# Patient Record
Sex: Female | Born: 1965 | Race: White | Hispanic: No | Marital: Married | Smoking: Never smoker
Health system: Southern US, Community
[De-identification: ages and names within clinical notes are randomized; demographics above are authoritative.]

## PROBLEM LIST (undated history)

## (undated) DIAGNOSIS — M1990 Unspecified osteoarthritis, unspecified site: Secondary | ICD-10-CM

## (undated) DIAGNOSIS — T7840XA Allergy, unspecified, initial encounter: Secondary | ICD-10-CM

## (undated) DIAGNOSIS — E039 Hypothyroidism, unspecified: Secondary | ICD-10-CM

## (undated) DIAGNOSIS — B019 Varicella without complication: Secondary | ICD-10-CM

## (undated) HISTORY — DX: Varicella without complication: B01.9

## (undated) HISTORY — PX: BREAST SURGERY: SHX581

## (undated) HISTORY — PX: ABDOMINAL HYSTERECTOMY: SHX81

## (undated) HISTORY — PX: REDUCTION MAMMAPLASTY: SUR839

## (undated) HISTORY — DX: Allergy, unspecified, initial encounter: T78.40XA

---

## 2007-11-03 ENCOUNTER — Encounter: Admission: RE | Admit: 2007-11-03 | Discharge: 2007-11-03 | Payer: Self-pay | Admitting: Obstetrics and Gynecology

## 2007-11-11 ENCOUNTER — Encounter: Admission: RE | Admit: 2007-11-11 | Discharge: 2007-11-11 | Payer: Self-pay | Admitting: Obstetrics and Gynecology

## 2011-12-07 ENCOUNTER — Encounter: Payer: Self-pay | Admitting: Family Medicine

## 2011-12-07 ENCOUNTER — Other Ambulatory Visit (INDEPENDENT_AMBULATORY_CARE_PROVIDER_SITE_OTHER): Payer: 59

## 2011-12-07 ENCOUNTER — Ambulatory Visit (INDEPENDENT_AMBULATORY_CARE_PROVIDER_SITE_OTHER): Payer: 59 | Admitting: Family Medicine

## 2011-12-07 VITALS — BP 124/80 | Temp 98.3°F | Ht 61.0 in | Wt 203.0 lb

## 2011-12-07 DIAGNOSIS — R5383 Other fatigue: Secondary | ICD-10-CM

## 2011-12-07 DIAGNOSIS — L659 Nonscarring hair loss, unspecified: Secondary | ICD-10-CM

## 2011-12-07 DIAGNOSIS — Z0000 Encounter for general adult medical examination without abnormal findings: Secondary | ICD-10-CM

## 2011-12-07 DIAGNOSIS — R635 Abnormal weight gain: Secondary | ICD-10-CM

## 2011-12-07 LAB — LIPID PANEL
Cholesterol: 178 mg/dL (ref 0–200)
HDL: 53.1 mg/dL (ref >39.00)
LDL Cholesterol: 103 mg/dL — ABNORMAL HIGH (ref 0–99)
Total CHOL/HDL Ratio: 3
Triglycerides: 109 mg/dL (ref 0.0–149.0)
VLDL: 21.8 mg/dL (ref 0.0–40.0)

## 2011-12-07 LAB — BASIC METABOLIC PANEL
CO2: 27 mEq/L (ref 19–32)
Calcium: 9.2 mg/dL (ref 8.4–10.5)
Creatinine, Ser: 0.8 mg/dL (ref 0.4–1.2)
GFR: 83.85 mL/min (ref >60.00)
Sodium: 136 mEq/L (ref 135–145)

## 2011-12-07 LAB — CBC WITH DIFFERENTIAL/PLATELET
Basophils Absolute: 0 10*3/uL (ref 0.0–0.1)
Basophils Relative: 0.4 % (ref 0.0–3.0)
Eosinophils Absolute: 0.2 10*3/uL (ref 0.0–0.7)
Hemoglobin: 13.5 g/dL (ref 12.0–15.0)
Lymphocytes Relative: 23.7 % (ref 12.0–46.0)
MCHC: 33.5 g/dL (ref 30.0–36.0)
Monocytes Relative: 9.5 % (ref 3.0–12.0)
Neutro Abs: 5.1 10*3/uL (ref 1.4–7.7)
Neutrophils Relative %: 63.9 % (ref 43.0–77.0)
RBC: 4.53 Mil/uL (ref 3.87–5.11)

## 2011-12-07 LAB — HEPATIC FUNCTION PANEL
AST: 28 U/L (ref 0–37)
Albumin: 4 g/dL (ref 3.5–5.2)
Alkaline Phosphatase: 55 U/L (ref 39–117)
Bilirubin, Direct: 0.2 mg/dL (ref 0.0–0.3)

## 2011-12-07 LAB — POCT URINALYSIS DIPSTICK
Blood, UA: NEGATIVE
Glucose, UA: NEGATIVE
Ketones, UA: NEGATIVE
Spec Grav, UA: <=1.005

## 2011-12-07 NOTE — Progress Notes (Signed)
  Subjective:    Patient ID: Savannah Summers, female    DOB: 05/04/1965, 46 y.o.   MRN: [id number]  HPI  Patient to establish care. Past medical history reviewed. She is not currently treated for any chronic medical problems. She has some perennial allergies. Surgical history breast reduction 1980s and uterine rupture 2000 and with partial hysterectomy. This occurred following childbirth.  Patient's had some recent weight gain. She reportedly had abnormal thyroid screen last year during work labs. Reportedly TSH was 8. Never evaluated. She's had some progressive fatigue, 20 pound weight gain over the past year and some alopecia which has been diffuse. Some of her anxiety she attributes to mother passing away several months ago. Mom died of complications of pulmonary fibrosis. Patient does not feel depressed as much as anxious.  Nonsmoker. Rare alcohol use. Walks for exercise about 3 days per week.  Family history mother and sister with history of rheumatoid arthritis.  Past Medical History  Diagnosis Date  . Chicken pox   . Allergy    Past Surgical History  Procedure Date  . Abdominal hysterectomy   . Breast surgery     breast reduction    reports that she has never smoked. She does not have any smokeless tobacco history on file. She reports that she drinks alcohol. Her drug history not on file. family history includes Arthritis in her mother and sister; Cancer in her other; Hypothyroidism in her mother; Pulmonary fibrosis in her mother; and Rheum arthritis in her mother and sister. Not on File    Review of Systems  Constitutional: Positive for fatigue and unexpected weight change. Negative for appetite change.  Eyes: Negative for visual disturbance.  Respiratory: Negative for cough and shortness of breath.   Cardiovascular: Negative for chest pain.  Gastrointestinal: Negative for abdominal pain and abdominal distention.  Genitourinary: Negative for dysuria.  Neurological: Negative  for dizziness and headaches.  Hematological: Negative for adenopathy.  Psychiatric/Behavioral: Negative for dysphoric mood.       Objective:   Physical Exam  Constitutional: She appears well-developed and well-nourished. No distress.  HENT:  Right Ear: External ear normal.  Left Ear: External ear normal.  Mouth/Throat: Oropharynx is clear and moist.  Neck: Neck supple. No thyromegaly present.  Cardiovascular: Normal rate and regular rhythm.  Exam reveals no gallop.   Pulmonary/Chest: Effort normal and breath sounds normal. No respiratory distress. She has no wheezes. She has no rales.  Musculoskeletal: She exhibits no edema.          Assessment & Plan:  Patient presents with gradual weight gain, fatigue, alopecia. Question hypothyroidism. Patient scheduled for complete physical next week. Screening lab work today. Discussed lifestyle modification with diet exercise further than

## 2011-12-09 ENCOUNTER — Encounter: Payer: Self-pay | Admitting: Family Medicine

## 2011-12-14 ENCOUNTER — Ambulatory Visit (INDEPENDENT_AMBULATORY_CARE_PROVIDER_SITE_OTHER): Payer: 59 | Admitting: Family Medicine

## 2011-12-14 ENCOUNTER — Encounter: Payer: Self-pay | Admitting: Family Medicine

## 2011-12-14 VITALS — BP 120/82 | HR 72 | Temp 98.2°F | Resp 12 | Ht 62.0 in | Wt 202.0 lb

## 2011-12-14 DIAGNOSIS — B351 Tinea unguium: Secondary | ICD-10-CM | POA: Insufficient documentation

## 2011-12-14 DIAGNOSIS — Z0000 Encounter for general adult medical examination without abnormal findings: Secondary | ICD-10-CM

## 2011-12-14 DIAGNOSIS — Z299 Encounter for prophylactic measures, unspecified: Secondary | ICD-10-CM

## 2011-12-14 DIAGNOSIS — E039 Hypothyroidism, unspecified: Secondary | ICD-10-CM

## 2011-12-14 MED ORDER — TETANUS-DIPHTH-ACELL PERTUSSIS 5-2.5-18.5 LF-MCG/0.5 IM SUSP: 0.5000 mL | Freq: Once | INTRAMUSCULAR | Status: DC

## 2011-12-14 MED ORDER — LEVOTHYROXINE SODIUM 25 MCG PO TABS: 25.0000 ug | ORAL_TABLET | Freq: Every day | ORAL | Status: DC

## 2011-12-14 MED ORDER — TERBINAFINE HCL 250 MG PO TABS: 250.0000 mg | ORAL_TABLET | Freq: Every day | ORAL | Status: AC

## 2011-12-14 NOTE — Patient Instructions (Addendum)

## 2011-12-14 NOTE — Progress Notes (Signed)
Subjective:    Patient ID: Savannah Summers, female    DOB: [birth year], 46 y.o.   MRN: [id number]  HPI  Patient is seen for complete physical. Refer to prior note. She has no real chronic medical problems. Has gradually gained weight past year with increased fatigue and occasional alopecia. Recent TSH 8. Exercising inconsistently. Has intermittent left knee pain which limits walking. Patient also had onychomycosis changes left great toe. She tried various topicals without relief. She is interested in systemic therapy if possible.  She has occasional associated pain with this nail. She plans to see gynecologist soon. No history of smoking. Last tetanus unknown -possibly over 10 years ago.  Past Medical History  Diagnosis Date  . Chicken pox   . Allergy    Past Surgical History  Procedure Date  . Abdominal hysterectomy   . Breast surgery     breast reduction    reports that she has never smoked. She does not have any smokeless tobacco history on file. She reports that she drinks alcohol. Her drug history not on file. family history includes Arthritis in her mother and sister; Cancer in her other; Hypothyroidism in her mother; Pulmonary fibrosis in her mother; and Rheum arthritis in her mother and sister. No Known Allergies    Review of Systems  Constitutional: Positive for fatigue. Negative for fever, activity change and appetite change.  HENT: Negative for hearing loss, ear pain, sore throat and trouble swallowing.   Eyes: Negative for visual disturbance.  Respiratory: Negative for cough and shortness of breath.   Cardiovascular: Negative for chest pain and palpitations.  Gastrointestinal: Negative for abdominal pain, diarrhea, constipation and blood in stool.  Genitourinary: Negative for dysuria and hematuria.  Musculoskeletal: Positive for arthralgias (Mostly knees). Negative for myalgias and back pain.  Skin: Negative for rash.  Neurological: Negative for dizziness, syncope and  headaches.  Hematological: Negative for adenopathy.  Psychiatric/Behavioral: Negative for confusion and dysphoric mood.       Objective:   Physical Exam  Constitutional: She is oriented to person, place, and time. She appears well-developed and well-nourished.  HENT:  Head: Normocephalic and atraumatic.  Eyes: EOM are normal. Pupils are equal, round, and reactive to light.  Neck: Normal range of motion. Neck supple. No thyromegaly present.  Cardiovascular: Normal rate, regular rhythm and normal heart sounds.   No murmur heard. Pulmonary/Chest: Breath sounds normal. No respiratory distress. She has no wheezes. She has no rales.  Abdominal: Soft. Bowel sounds are normal. She exhibits no distension and no mass. There is no tenderness. There is no rebound and no guarding.  Genitourinary:       Per gyn  Musculoskeletal: Normal range of motion. She exhibits no edema.  Lymphadenopathy:    She has no cervical adenopathy.  Neurological: She is alert and oriented to person, place, and time. She displays normal reflexes. No cranial nerve deficit.  Skin: No rash noted.       Left great toe no refills brittle changes with some thickening and scaling. Approximately 1/2 the nail is broken off  Psychiatric: She has a normal mood and affect. Her behavior is normal. Judgment and thought content normal.          Assessment & Plan:  Complete physical. Labs reviewed. Tetanus given. Increase exercise and work on weight loss.  Hypothyroidism. Initiate Levothroid 25 mcg once daily. Recheck TSH 3 months  Onychomycosis left great toenail. Discussed risk and benefits of treatment with Lamisil and patient consents to start treatment.  Lamisil 250 mg once daily for 3 months.

## 2012-02-20 ENCOUNTER — Other Ambulatory Visit: Payer: Self-pay | Admitting: Obstetrics and Gynecology

## 2012-02-20 DIAGNOSIS — Z9889 Other specified postprocedural states: Secondary | ICD-10-CM

## 2012-02-20 DIAGNOSIS — Z1231 Encounter for screening mammogram for malignant neoplasm of breast: Secondary | ICD-10-CM

## 2012-03-06 ENCOUNTER — Other Ambulatory Visit (INDEPENDENT_AMBULATORY_CARE_PROVIDER_SITE_OTHER): Payer: 59

## 2012-03-06 DIAGNOSIS — E039 Hypothyroidism, unspecified: Secondary | ICD-10-CM

## 2012-03-07 ENCOUNTER — Other Ambulatory Visit: Payer: Self-pay | Admitting: Family Medicine

## 2012-03-07 ENCOUNTER — Telehealth: Payer: Self-pay | Admitting: Family Medicine

## 2012-03-07 MED ORDER — LEVOTHYROXINE SODIUM 25 MCG PO TABS: 25.0000 ug | ORAL_TABLET | Freq: Every day | ORAL | Status: DC

## 2012-03-07 NOTE — Telephone Encounter (Signed)
Patient calling to find out results of labwork done 03/06/12.  States she expected to hear from office with results 03/06/12 so is calling in follow up.  Per Epic, TSH results available but not released by MD; info to office for provider review/callback.   Patient also requests activation code for MyChart.   May reach patient at [phone].

## 2012-03-09 NOTE — Telephone Encounter (Signed)
I think this patient has been called with result.  Make sure we give her activation code, if she does not already have.

## 2012-03-10 ENCOUNTER — Encounter: Payer: Self-pay | Admitting: *Deleted

## 2012-03-10 NOTE — Telephone Encounter (Signed)
I sent pt Mychart information to her home

## 2012-03-20 ENCOUNTER — Ambulatory Visit
Admission: RE | Admit: 2012-03-20 | Discharge: 2012-03-20 | Disposition: A | Discharge status: Home / Self Care | Payer: 59 | Source: Ambulatory Visit | Attending: Obstetrics and Gynecology | Admitting: Obstetrics and Gynecology

## 2012-03-20 DIAGNOSIS — Z9889 Other specified postprocedural states: Secondary | ICD-10-CM

## 2012-03-20 DIAGNOSIS — Z1231 Encounter for screening mammogram for malignant neoplasm of breast: Secondary | ICD-10-CM

## 2012-05-31 ENCOUNTER — Other Ambulatory Visit: Payer: Self-pay

## 2012-12-08 ENCOUNTER — Other Ambulatory Visit: Payer: Self-pay | Admitting: Family Medicine

## 2012-12-19 ENCOUNTER — Encounter: Payer: Self-pay | Admitting: Family Medicine

## 2012-12-19 ENCOUNTER — Ambulatory Visit (INDEPENDENT_AMBULATORY_CARE_PROVIDER_SITE_OTHER): Payer: 59 | Admitting: Family Medicine

## 2012-12-19 VITALS — BP 126/80 | HR 60 | Temp 97.6°F | Wt 193.0 lb

## 2012-12-19 DIAGNOSIS — E669 Obesity, unspecified: Secondary | ICD-10-CM | POA: Insufficient documentation

## 2012-12-19 DIAGNOSIS — E039 Hypothyroidism, unspecified: Secondary | ICD-10-CM

## 2012-12-19 MED ORDER — LEVOTHYROXINE SODIUM 25 MCG PO TABS: 25.0000 ug | ORAL_TABLET | Freq: Every day | ORAL | Status: DC

## 2012-12-19 NOTE — Progress Notes (Signed)
  Subjective:    Patient ID: Savannah Summers, female    DOB: 26-Apr-1965, 47 y.o.   MRN: [id number]  HPI Patient for followup hypothyroidism. She takes low-dose levothyroxine 25 mcg daily. Compliant with therapy. She denies any side effects. She denies any fatigue or any other symptoms of hypothyroidism such as constipation or cold intolerance. She continues to see gynecologist yearly for well visit. Needs followup TSH. She plans to get flu vaccine through her work  Past Medical History  Diagnosis Date  . Chicken pox   . Allergy    Past Surgical History  Procedure Laterality Date  . Abdominal hysterectomy    . Breast surgery      breast reduction    reports that she has never smoked. She does not have any smokeless tobacco history on file. She reports that  drinks alcohol. Her drug history is not on file. family history includes Arthritis in her mother and sister; Cancer in her other; Hypothyroidism in her mother; Pulmonary fibrosis in her mother; Rheum arthritis in her mother and sister. No Known Allergies    Review of Systems  Constitutional: Negative for appetite change, fatigue and unexpected weight change.  Respiratory: Negative for cough and shortness of breath.   Cardiovascular: Negative for chest pain.       Objective:   Physical Exam  Constitutional: She appears well-developed and well-nourished.  Neck: Neck supple. No thyromegaly present.  Cardiovascular: Normal rate and regular rhythm.   Pulmonary/Chest: Effort normal and breath sounds normal. No respiratory distress. She has no wheezes. She has no rales.  Musculoskeletal: She exhibits no edema.          Assessment & Plan:  Hypothyroidism. Clinically stable. Recheck TSH. Refill medication for one year.

## 2012-12-19 NOTE — Patient Instructions (Signed)

## 2012-12-22 MED ORDER — LEVOTHYROXINE SODIUM 50 MCG PO TABS: 50.0000 ug | ORAL_TABLET | Freq: Every day | ORAL | Status: DC

## 2012-12-22 NOTE — Addendum Note (Signed)
Addended by: Thomasena Edis on: 12/22/2012 12:34 PM   Modules accepted: Orders

## 2013-02-19 ENCOUNTER — Other Ambulatory Visit: Payer: Self-pay

## 2013-02-25 ENCOUNTER — Ambulatory Visit (INDEPENDENT_AMBULATORY_CARE_PROVIDER_SITE_OTHER): Payer: 59 | Admitting: Family Medicine

## 2013-02-25 ENCOUNTER — Encounter: Payer: Self-pay | Admitting: Family Medicine

## 2013-02-25 VITALS — BP 128/78 | HR 84 | Temp 98.0°F | Wt 197.0 lb

## 2013-02-25 DIAGNOSIS — J209 Acute bronchitis, unspecified: Secondary | ICD-10-CM

## 2013-02-25 MED ORDER — BENZONATATE 200 MG PO CAPS: 200.0000 mg | ORAL_CAPSULE | Freq: Three times a day (TID) | ORAL | Status: DC | PRN

## 2013-02-25 NOTE — Patient Instructions (Signed)
Acute Bronchitis Bronchitis is inflammation of the airways that extend from the windpipe into the lungs (bronchi). The inflammation often causes mucus to develop. This leads to a cough, which is the most common symptom of bronchitis.  In acute bronchitis, the condition usually develops suddenly and goes away over time, usually in a couple weeks. Smoking, allergies, and asthma can make bronchitis worse. Repeated episodes of bronchitis may cause further lung problems.  CAUSES Acute bronchitis is most often caused by the same virus that causes a cold. The virus can spread from person to person (contagious).  SIGNS AND SYMPTOMS   Cough.   Fever.   Coughing up mucus.   Body aches.   Chest congestion.   Chills.   Shortness of breath.   Sore throat.  DIAGNOSIS  Acute bronchitis is usually diagnosed through a physical exam. Tests, such as chest X-rays, are sometimes done to rule out other conditions.  TREATMENT  Acute bronchitis usually goes away in a couple weeks. Often times, no medical treatment is necessary. Medicines are sometimes given for relief of fever or cough. Antibiotics are usually not needed but may be prescribed in certain situations. In some cases, an inhaler may be recommended to help reduce shortness of breath and control the cough. A cool mist vaporizer may also be used to help thin bronchial secretions and make it easier to clear the chest.  HOME CARE INSTRUCTIONS  Get plenty of rest.   Drink enough fluids to keep your urine clear or pale yellow (unless you have a medical condition that requires fluid restriction). Increasing fluids may help thin your secretions and will prevent dehydration.   Only take over-the-counter or prescription medicines as directed by your health care provider.   Avoid smoking and secondhand smoke. Exposure to cigarette smoke or irritating chemicals will make bronchitis worse. If you are a smoker, consider using nicotine gum or skin  patches to help control withdrawal symptoms. Quitting smoking will help your lungs heal faster.   Reduce the chances of another bout of acute bronchitis by washing your hands frequently, avoiding people with cold symptoms, and trying not to touch your hands to your mouth, nose, or eyes.   Follow up with your health care provider as directed.  SEEK MEDICAL CARE IF: Your symptoms do not improve after 1 week of treatment.  SEEK IMMEDIATE MEDICAL CARE IF:  You develop an increased fever or chills.   You have chest pain.   You have severe shortness of breath.  You have bloody sputum.   You develop dehydration.  You develop fainting.  You develop repeated vomiting.  You develop a severe headache. MAKE SURE YOU:   Understand these instructions.  Will watch your condition.  Will get help right away if you are not doing well or get worse. Document Released: 05/10/2004 Document Revised: 12/03/2012 Document Reviewed: 09/23/2012 [facility] Patient Information 2014 [place], [place].  Consider Zantac or Pepcid for throat irritation symptoms.

## 2013-02-25 NOTE — Progress Notes (Signed)
  Subjective:    Patient ID: Savannah Summers, female    DOB: 20-Aug-1965, 47 y.o.   MRN: [id number]  HPI Patient here for acute visit Onset about 5 days ago of body aches, malaise, intermittent headache, cough and minimal nasal congestion. This past Sunday she had fever up to 101 and chills but no documented fever since then. She denies any nausea or vomiting. Husband now has similar symptoms. Cough especially bothersome at night. Interfering with sleep. No wheezing. No dyspnea. Nonsmoker  Past Medical History  Diagnosis Date  . Chicken pox   . Allergy    Past Surgical History  Procedure Laterality Date  . Abdominal hysterectomy    . Breast surgery      breast reduction    reports that she has never smoked. She does not have any smokeless tobacco history on file. She reports that she drinks alcohol. Her drug history is not on file. family history includes Arthritis in her mother and sister; Cancer in her other; Hypothyroidism in her mother; Pulmonary fibrosis in her mother; Rheum arthritis in her mother and sister. No Known Allergies    Review of Systems  Constitutional: Positive for fatigue. Negative for fever.  HENT: Positive for sore throat.   Respiratory: Positive for cough. Negative for wheezing.   Neurological: Positive for headaches.       Objective:   Physical Exam  Constitutional: She appears well-developed and well-nourished.  HENT:  Right Ear: External ear normal.  Left Ear: External ear normal.  Mouth/Throat: Oropharynx is clear and moist.  Neck: Neck supple.  Cardiovascular: Normal rate and regular rhythm.   Pulmonary/Chest: Effort normal and breath sounds normal. No respiratory distress. She has no wheezes. She has no rales.  Lymphadenopathy:    She has no cervical adenopathy.          Assessment & Plan:  Acute bronchitis. Suspect viral. Tessalon Perles 200 mg every 8 hours as needed for cough. Followup promptly for recurrent fever or worsening  symptoms

## 2013-02-25 NOTE — Progress Notes (Signed)
Pre visit review using our clinic review tool, if applicable. No additional management support is needed unless otherwise documented below in the visit note. 

## 2013-04-21 ENCOUNTER — Other Ambulatory Visit (INDEPENDENT_AMBULATORY_CARE_PROVIDER_SITE_OTHER): Payer: 59

## 2013-04-21 ENCOUNTER — Encounter: Payer: Self-pay | Admitting: Family Medicine

## 2013-04-21 DIAGNOSIS — E039 Hypothyroidism, unspecified: Secondary | ICD-10-CM

## 2013-04-21 LAB — TSH: TSH: 1.36 u[IU]/mL (ref 0.35–5.50)

## 2013-07-31 ENCOUNTER — Other Ambulatory Visit: Payer: Self-pay

## 2013-07-31 DIAGNOSIS — Z1231 Encounter for screening mammogram for malignant neoplasm of breast: Secondary | ICD-10-CM

## 2013-08-07 ENCOUNTER — Ambulatory Visit
Admission: RE | Admit: 2013-08-07 | Discharge: 2013-08-07 | Disposition: A | Discharge status: Home / Self Care | Payer: Self-pay | Source: Ambulatory Visit

## 2013-08-07 DIAGNOSIS — Z1231 Encounter for screening mammogram for malignant neoplasm of breast: Secondary | ICD-10-CM

## 2013-09-15 ENCOUNTER — Ambulatory Visit: Payer: 59 | Attending: Orthopedic Surgery

## 2013-09-15 DIAGNOSIS — M25669 Stiffness of unspecified knee, not elsewhere classified: Secondary | ICD-10-CM | POA: Insufficient documentation

## 2013-09-15 DIAGNOSIS — M25569 Pain in unspecified knee: Secondary | ICD-10-CM | POA: Insufficient documentation

## 2013-09-15 DIAGNOSIS — IMO0001 Reserved for inherently not codable concepts without codable children: Secondary | ICD-10-CM | POA: Insufficient documentation

## 2013-09-15 DIAGNOSIS — R262 Difficulty in walking, not elsewhere classified: Secondary | ICD-10-CM | POA: Insufficient documentation

## 2013-09-17 ENCOUNTER — Encounter: Payer: 59 | Admitting: Physical Therapy

## 2013-09-23 ENCOUNTER — Ambulatory Visit: Payer: 59 | Admitting: Rehabilitation

## 2013-09-28 ENCOUNTER — Ambulatory Visit: Payer: 59 | Admitting: Rehabilitation

## 2013-10-01 ENCOUNTER — Encounter: Payer: 59 | Admitting: Physical Therapy

## 2013-10-05 ENCOUNTER — Encounter: Payer: 59 | Admitting: Rehabilitation

## 2013-10-08 ENCOUNTER — Encounter: Payer: 59 | Admitting: Rehabilitation

## 2013-12-22 ENCOUNTER — Other Ambulatory Visit: Payer: Self-pay | Admitting: Family Medicine

## 2013-12-25 ENCOUNTER — Ambulatory Visit (INDEPENDENT_AMBULATORY_CARE_PROVIDER_SITE_OTHER): Payer: 59 | Admitting: Family Medicine

## 2013-12-25 ENCOUNTER — Encounter: Payer: Self-pay | Admitting: Family Medicine

## 2013-12-25 VITALS — BP 128/76 | HR 78 | Wt 200.0 lb

## 2013-12-25 DIAGNOSIS — E038 Other specified hypothyroidism: Secondary | ICD-10-CM

## 2013-12-25 LAB — TSH: TSH: 0.53 u[IU]/mL (ref 0.35–4.50)

## 2013-12-25 NOTE — Progress Notes (Signed)
   Subjective:    Patient ID: Savannah Summers, female    DOB: [birth year], 48 y.o.   MRN: [id number]  HPI  Followup hypothyroidism. Takes levothyroxin 50 mcg daily. Compliant with therapy. Last lab work was in January. Denies any symptoms of hypothyroidism. No side effects from medication.  Past Medical History  Diagnosis Date  . Chicken pox   . Allergy    Past Surgical History  Procedure Laterality Date  . Abdominal hysterectomy    . Breast surgery      breast reduction    reports that she has never smoked. She does not have any smokeless tobacco history on file. She reports that she drinks alcohol. Her drug history is not on file. family history includes Arthritis in her mother and sister; Cancer in her other; Hypothyroidism in her mother; Pulmonary fibrosis in her mother; Rheum arthritis in her mother and sister. No Known Allergies   Review of Systems  Constitutional: Negative for appetite change and unexpected weight change.  Respiratory: Negative for shortness of breath.   Cardiovascular: Negative for chest pain.  Endocrine: Negative for cold intolerance.       Objective:   Physical Exam  Constitutional: She appears well-developed and well-nourished.  Neck: Neck supple. No thyromegaly present.  Cardiovascular: Normal rate and regular rhythm.   Pulmonary/Chest: Effort normal and breath sounds normal. No respiratory distress. She has no wheezes. She has no rales.  Musculoskeletal: She exhibits no edema.          Assessment & Plan:  Hypothyroidism. Recheck TSH. Adjust medication accordingly

## 2013-12-25 NOTE — Progress Notes (Signed)
Pre visit review using our clinic review tool, if applicable. No additional management support is needed unless otherwise documented below in the visit note. 

## 2014-02-11 ENCOUNTER — Encounter: Payer: Self-pay | Admitting: Family Medicine

## 2014-02-15 ENCOUNTER — Encounter: Payer: Self-pay | Admitting: Family Medicine

## 2014-03-22 ENCOUNTER — Other Ambulatory Visit: Payer: Self-pay | Admitting: Family Medicine

## 2014-12-16 ENCOUNTER — Other Ambulatory Visit: Payer: Self-pay | Admitting: Family Medicine

## 2014-12-16 ENCOUNTER — Telehealth: Payer: Self-pay | Admitting: Family Medicine

## 2014-12-16 MED ORDER — LEVOTHYROXINE SODIUM 50 MCG PO TABS: 50.0000 ug | ORAL_TABLET | Freq: Every day | ORAL | Status: DC

## 2014-12-16 NOTE — Telephone Encounter (Signed)
Pt request refill of the following: levothyroxine (SYNTHROID, LEVOTHROID) 50 MCG tablet  Pt has made an appt for 12/24/14 and is asking for some med till her appt    Phamacy: [facility]

## 2014-12-16 NOTE — Telephone Encounter (Signed)
Rx sent to pharmacy   

## 2014-12-24 ENCOUNTER — Ambulatory Visit (INDEPENDENT_AMBULATORY_CARE_PROVIDER_SITE_OTHER): Payer: 59 | Admitting: Family Medicine

## 2014-12-24 ENCOUNTER — Ambulatory Visit: Payer: 59 | Admitting: Family Medicine

## 2014-12-24 ENCOUNTER — Encounter: Payer: Self-pay | Admitting: Family Medicine

## 2014-12-24 VITALS — BP 104/70 | HR 58 | Temp 97.5°F | Wt 189.7 lb

## 2014-12-24 DIAGNOSIS — E038 Other specified hypothyroidism: Secondary | ICD-10-CM

## 2014-12-24 LAB — TSH: TSH: 3.68 u[IU]/mL (ref 0.35–4.50)

## 2014-12-24 MED ORDER — LEVOTHYROXINE SODIUM 50 MCG PO TABS: 50.0000 ug | ORAL_TABLET | Freq: Every day | ORAL | Status: DC

## 2014-12-24 NOTE — Progress Notes (Signed)
   Subjective:    Patient ID: Savannah Summers, female    DOB: [birth year], 49 y.o.   MRN: [id number]  HPI Follow-up hypothyroidism. She takes levothyroxin 50 g daily. She has joined [employer] recently started exercising 3-4 days a week. She is lost about 11 pounds since last year. She feels great overall. Compliant with medication. No side effects.  Past Medical History  Diagnosis Date  . Chicken pox   . Allergy    Past Surgical History  Procedure Laterality Date  . Abdominal hysterectomy    . Breast surgery      breast reduction    reports that she has never smoked. She does not have any smokeless tobacco history on file. She reports that she drinks alcohol. Her drug history is not on file. family history includes Arthritis in her mother and sister; Cancer in her other; Hypothyroidism in her mother; Pulmonary fibrosis in her mother; Rheum arthritis in her mother and sister. No Known Allergies    Review of Systems  Constitutional: Negative for chills, appetite change and unexpected weight change.  Respiratory: Negative for shortness of breath.   Cardiovascular: Negative for chest pain.       Objective:   Physical Exam  Constitutional: She appears well-developed and well-nourished.  Neck: Neck supple. No thyromegaly present.  Cardiovascular: Normal rate and regular rhythm.  Exam reveals no gallop.   Pulmonary/Chest: Effort normal and breath sounds normal. No respiratory distress. She has no wheezes. She has no rales.  Musculoskeletal: She exhibits no edema.          Assessment & Plan:  Hypothyroidism. Recheck TSH. Refill medication for one year. Continue weight loss efforts. Consider complete physical at some point this year

## 2014-12-24 NOTE — Progress Notes (Signed)
Pre visit review using our clinic review tool, if applicable. No additional management support is needed unless otherwise documented below in the visit note. 

## 2014-12-24 NOTE — Patient Instructions (Signed)
Hypothyroidism The thyroid is a large gland located in the lower front of your neck. The thyroid gland helps control metabolism. Metabolism is how your body handles food. It controls metabolism with the hormone thyroxine. When this gland is underactive (hypothyroid), it produces too little hormone.  CAUSES These include:   Absence or destruction of thyroid tissue.  Goiter due to iodine deficiency.  Goiter due to medications.  Congenital defects (since birth).  Problems with the pituitary. This causes a lack of TSH (thyroid stimulating hormone). This hormone tells the thyroid to turn out more hormone. SYMPTOMS  Lethargy (feeling as though you have no energy)  Cold intolerance  Weight gain (in spite of normal food intake)  Dry skin  Coarse hair  Menstrual irregularity (if severe, may lead to infertility)  Slowing of thought processes Cardiac problems are also caused by insufficient amounts of thyroid hormone. Hypothyroidism in the newborn is cretinism, and is an extreme form. It is important that this form be treated adequately and immediately or it will lead rapidly to retarded physical and mental development. DIAGNOSIS  To prove hypothyroidism, your caregiver may do blood tests and ultrasound tests. Sometimes the signs are hidden. It may be necessary for your caregiver to watch this illness with blood tests either before or after diagnosis and treatment. TREATMENT  Low levels of thyroid hormone are increased by using synthetic thyroid hormone. This is a safe, effective treatment. It usually takes about four weeks to gain the full effects of the medication. After you have the full effect of the medication, it will generally take another four weeks for problems to leave. Your caregiver may start you on low doses. If you have had heart problems the dose may be gradually increased. It is generally not an emergency to get rapidly to normal. HOME CARE INSTRUCTIONS   Take your  medications as your caregiver suggests. Let your caregiver know of any medications you are taking or start taking. Your caregiver will help you with dosage schedules.  As your condition improves, your dosage needs may increase. It will be necessary to have continuing blood tests as suggested by your caregiver.  Report all suspected medication side effects to your caregiver. SEEK MEDICAL CARE IF: Seek medical care if you develop:  Sweating.  Tremulousness (tremors).  Anxiety.  Rapid weight loss.  Heat intolerance.  Emotional swings.  Diarrhea.  Weakness. SEEK IMMEDIATE MEDICAL CARE IF:  You develop chest pain, an irregular heart beat (palpitations), or a rapid heart beat. MAKE SURE YOU:   Understand these instructions.  Will watch your condition.  Will get help right away if you are not doing well or get worse. Document Released: 04/02/2005 Document Revised: 06/25/2011 Document Reviewed: 11/21/2007 [employer] Patient Information 2015 [employer]. This information is not intended to replace advice given to you by your health care provider. Make sure you discuss any questions you have with your health care provider.  

## 2015-01-07 IMAGING — MG MM SCREEN MAMMOGRAM BILATERAL
5 series · 5 of 5 positions shown · non-contrast
Comparison: Previous exam(s).

CLINICAL DATA: Screening.

EXAM:
DIGITAL SCREENING BILATERAL MAMMOGRAM WITH CAD

[R CC]
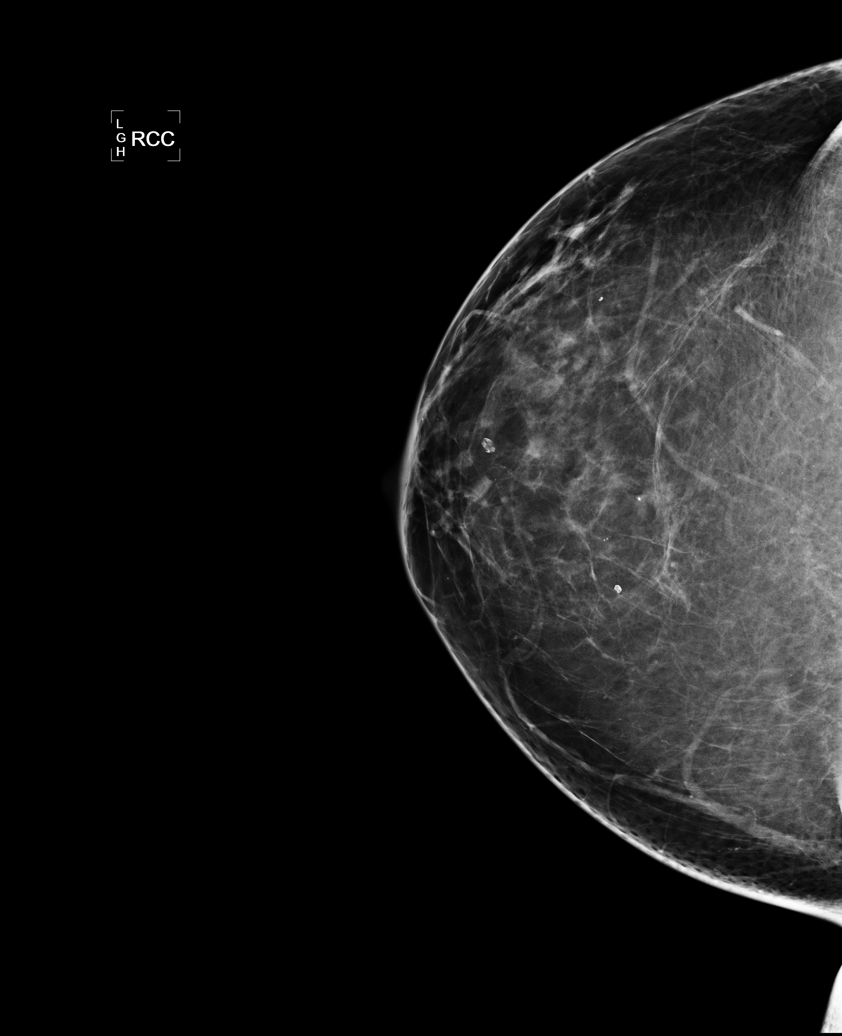

[L CC]
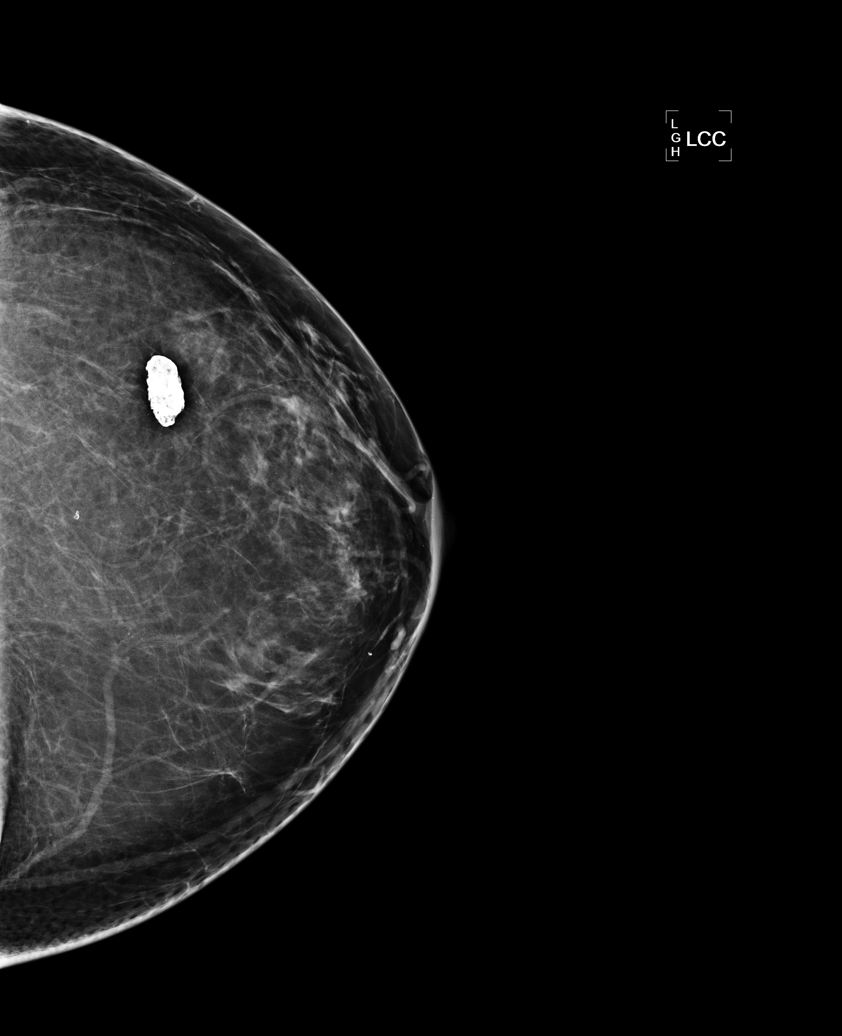

[L MLO (1 of 2)]
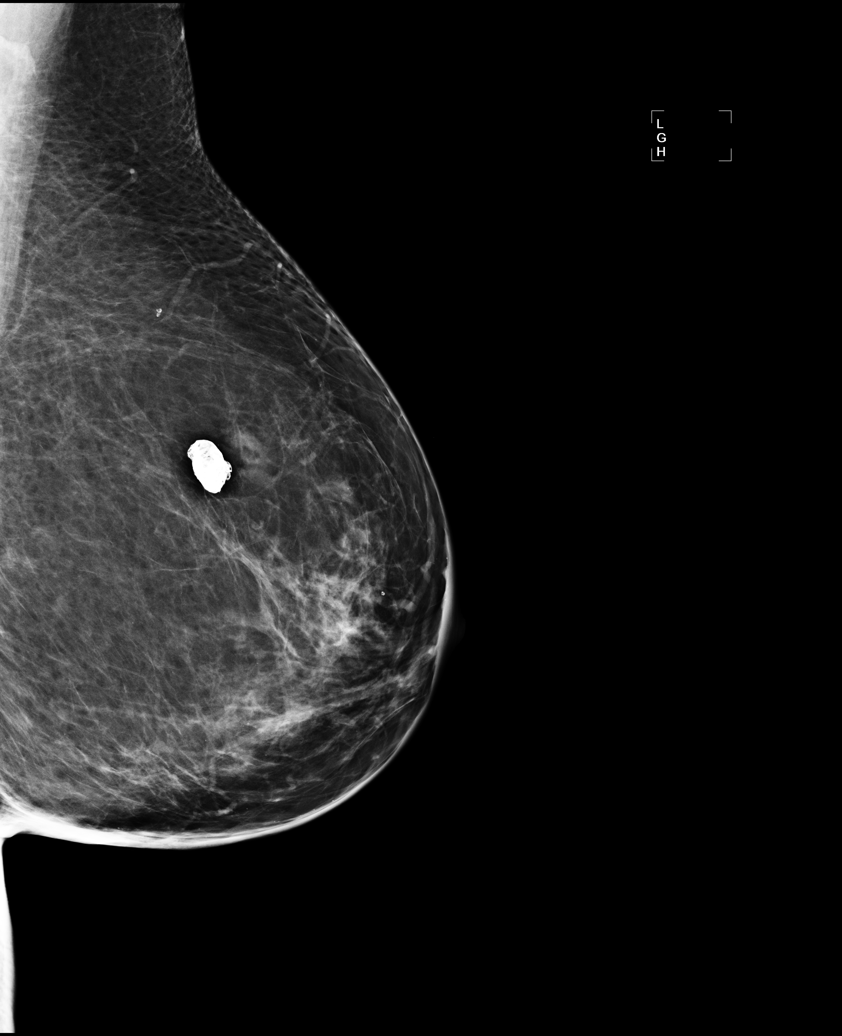

[R MLO]
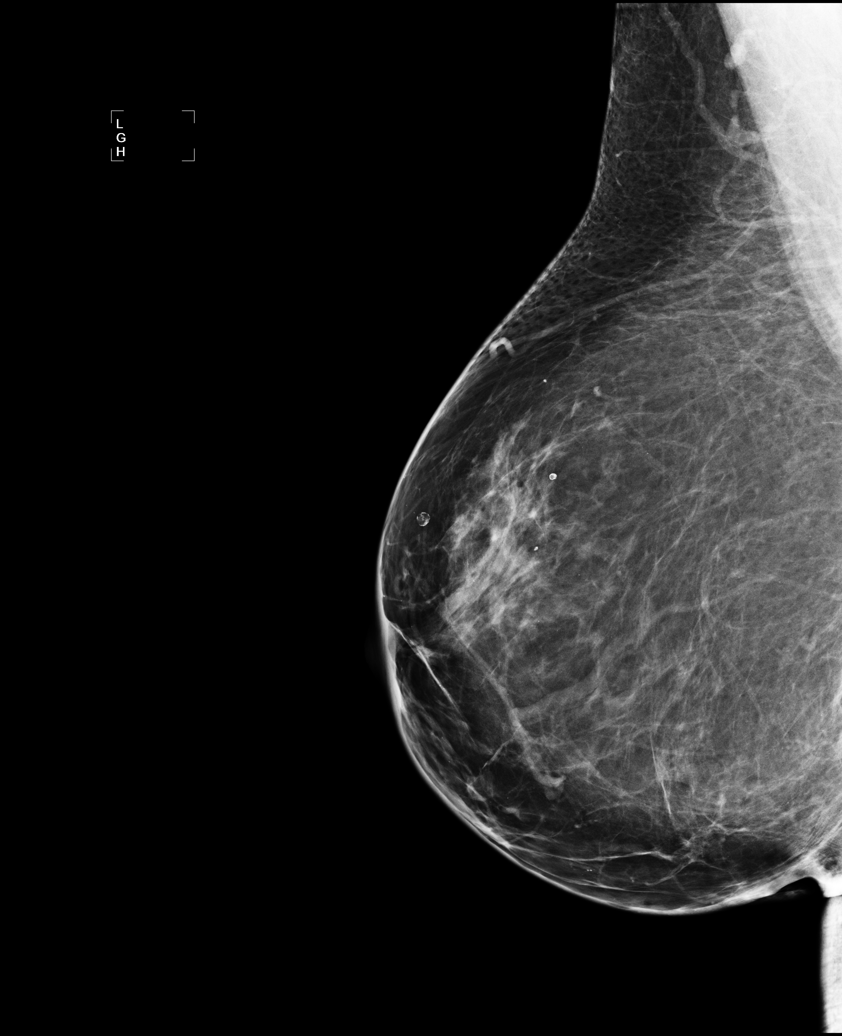

[L MLO (2 of 2)]
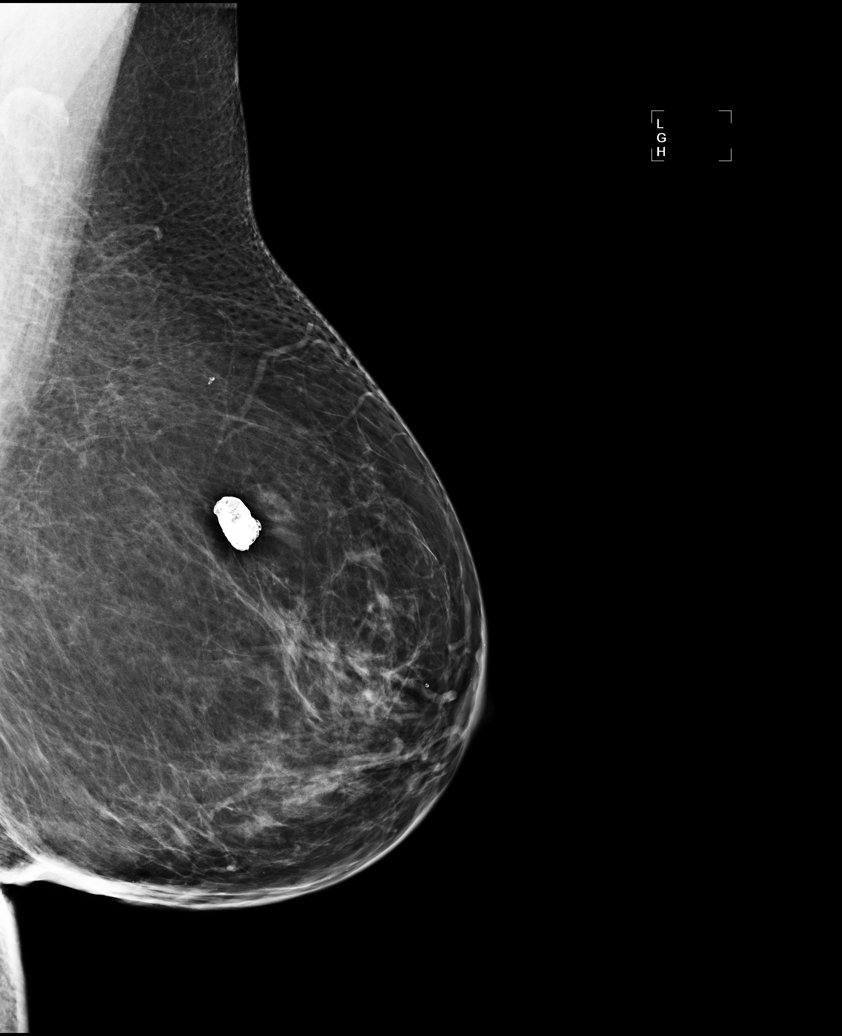

[5 of 5 positions shown; findings below may reference images not displayed]

ACR Breast Density Category b: There are scattered areas of
fibroglandular density.
FINDINGS: There are no findings suspicious for malignancy. Images were
processed with CAD.
IMPRESSION: No mammographic evidence of malignancy. A result letter of this
screening mammogram will be mailed directly to the patient.

RECOMMENDATION:
Screening mammogram in one year. (Code:AS-G-LCT)

BI-RADS CATEGORY  1: Negative.

## 2015-05-11 ENCOUNTER — Other Ambulatory Visit: Payer: Self-pay

## 2015-05-11 DIAGNOSIS — Z1231 Encounter for screening mammogram for malignant neoplasm of breast: Secondary | ICD-10-CM

## 2015-05-19 ENCOUNTER — Other Ambulatory Visit: Payer: 59

## 2015-05-23 ENCOUNTER — Ambulatory Visit
Admission: RE | Admit: 2015-05-23 | Discharge: 2015-05-23 | Disposition: A | Discharge status: Home / Self Care | Payer: 59 | Source: Ambulatory Visit

## 2015-05-23 DIAGNOSIS — Z1231 Encounter for screening mammogram for malignant neoplasm of breast: Secondary | ICD-10-CM

## 2015-05-25 ENCOUNTER — Encounter: Payer: 59 | Admitting: Family Medicine

## 2015-05-27 ENCOUNTER — Other Ambulatory Visit (INDEPENDENT_AMBULATORY_CARE_PROVIDER_SITE_OTHER): Payer: 59

## 2015-05-27 DIAGNOSIS — Z0000 Encounter for general adult medical examination without abnormal findings: Secondary | ICD-10-CM | POA: Diagnosis not present

## 2015-05-27 LAB — TSH: TSH: 2.1 u[IU]/mL (ref 0.35–4.50)

## 2015-05-27 LAB — HEPATIC FUNCTION PANEL
ALBUMIN: 3.9 g/dL (ref 3.5–5.2)
ALK PHOS: 81 U/L (ref 39–117)
ALT: 9 U/L (ref 0–35)
AST: 14 U/L (ref 0–37)
Bilirubin, Direct: 0.1 mg/dL (ref 0.0–0.3)
TOTAL PROTEIN: 7.1 g/dL (ref 6.0–8.3)
Total Bilirubin: 0.6 mg/dL (ref 0.2–1.2)

## 2015-05-27 LAB — BASIC METABOLIC PANEL
BUN: 11 mg/dL (ref 6–23)
CALCIUM: 9.5 mg/dL (ref 8.4–10.5)
CO2: 31 meq/L (ref 19–32)
Chloride: 106 mEq/L (ref 96–112)
Creatinine, Ser: 0.83 mg/dL (ref 0.40–1.20)
GFR: 76.95 mL/min (ref >60.00)
GLUCOSE: 101 mg/dL — AB (ref 70–99)
POTASSIUM: 4.8 meq/L (ref 3.5–5.1)
SODIUM: 141 meq/L (ref 135–145)

## 2015-05-27 LAB — LIPID PANEL
CHOL/HDL RATIO: 4
Cholesterol: 172 mg/dL (ref 0–200)
HDL: 47.3 mg/dL (ref >39.00)
LDL Cholesterol: 112 mg/dL — ABNORMAL HIGH (ref 0–99)
NONHDL: 124.62
Triglycerides: 61 mg/dL (ref 0.0–149.0)
VLDL: 12.2 mg/dL (ref 0.0–40.0)

## 2015-05-29 LAB — CBC WITH DIFFERENTIAL/PLATELET
BASOS ABS: 0 10*3/uL (ref 0.0–0.1)
Basophils Relative: 0.6 % (ref 0.0–3.0)
EOS PCT: 1.9 % (ref 0.0–5.0)
Eosinophils Absolute: 0.1 10*3/uL (ref 0.0–0.7)
HEMATOCRIT: 45.3 % (ref 36.0–46.0)
Hemoglobin: 13.5 g/dL (ref 12.0–15.0)
LYMPHS ABS: 1.4 10*3/uL (ref 0.7–4.0)
LYMPHS PCT: 29 % (ref 12.0–46.0)
MCHC: 29.8 g/dL — AB (ref 30.0–36.0)
MCV: 95.6 fl (ref 78.0–100.0)
MONOS PCT: 13.8 % — AB (ref 3.0–12.0)
Monocytes Absolute: 0.7 10*3/uL (ref 0.1–1.0)
NEUTROS ABS: 2.7 10*3/uL (ref 1.4–7.7)
Neutrophils Relative %: 54.7 % (ref 43.0–77.0)
PLATELETS: 285 10*3/uL (ref 150.0–400.0)
RBC: 4.74 Mil/uL (ref 3.87–5.11)
RDW: 14.4 % (ref 11.5–15.5)
WBC: 4.9 10*3/uL (ref 4.0–10.5)

## 2015-06-10 ENCOUNTER — Ambulatory Visit (INDEPENDENT_AMBULATORY_CARE_PROVIDER_SITE_OTHER): Payer: 59 | Admitting: Family Medicine

## 2015-06-10 VITALS — BP 110/80 | HR 81 | Temp 97.8°F | Ht 61.75 in | Wt 159.5 lb

## 2015-06-10 DIAGNOSIS — Z0000 Encounter for general adult medical examination without abnormal findings: Secondary | ICD-10-CM

## 2015-06-10 NOTE — Progress Notes (Signed)
   Subjective:    Patient ID: Savannah Summers, female    DOB: 08-Dec-1965, 50 y.o.   MRN: [id number]  HPI Patient seen for physical. Started Weight Watchers recently. Has lost about 50 pounds. Feels much better overall.  She's had previous hysterectomy. Just had repeat mammogram. Never had screening colonoscopy. Exercising with elliptical 3 times per week Does have severe osteoarthritis right knee and is followed by orthopedics and currently getting Synvisc injections  Past Medical History  Diagnosis Date  . Chicken pox   . Allergy    Past Surgical History  Procedure Laterality Date  . Abdominal hysterectomy    . Breast surgery      breast reduction    reports that she has never smoked. She does not have any smokeless tobacco history on file. She reports that she drinks alcohol. Her drug history is not on file. family history includes Arthritis in her mother and sister; Cancer in her other; Hypothyroidism in her mother; Pulmonary fibrosis in her mother; Rheum arthritis in her mother and sister. No Known Allergies    Review of Systems  Constitutional: Negative for fever, activity change, appetite change, fatigue and unexpected weight change.  HENT: Negative for ear pain, hearing loss, sore throat and trouble swallowing.   Eyes: Negative for visual disturbance.  Respiratory: Negative for cough and shortness of breath.   Cardiovascular: Negative for chest pain and palpitations.  Gastrointestinal: Negative for abdominal pain, diarrhea, constipation and blood in stool.  Genitourinary: Negative for dysuria and hematuria.  Musculoskeletal: Positive for arthralgias (Mostly right knee). Negative for myalgias and back pain.  Skin: Negative for rash.  Neurological: Negative for dizziness, syncope and headaches.  Hematological: Negative for adenopathy.  Psychiatric/Behavioral: Negative for confusion and dysphoric mood.       Objective:   Physical Exam  Constitutional: She is oriented  to person, place, and time. She appears well-developed and well-nourished.  HENT:  Head: Normocephalic and atraumatic.  Eyes: EOM are normal. Pupils are equal, round, and reactive to light.  Neck: Normal range of motion. Neck supple. No thyromegaly present.  Cardiovascular: Normal rate, regular rhythm and normal heart sounds.   No murmur heard. Pulmonary/Chest: Breath sounds normal. No respiratory distress. She has no wheezes. She has no rales.  Abdominal: Soft. Bowel sounds are normal. She exhibits no distension and no mass. There is no tenderness. There is no rebound and no guarding.  Musculoskeletal: Normal range of motion. She exhibits no edema.  Lymphadenopathy:    She has no cervical adenopathy.  Neurological: She is alert and oriented to person, place, and time. She displays normal reflexes. No cranial nerve deficit.  Skin: No rash noted.  Psychiatric: She has a normal mood and affect. Her behavior is normal. Judgment and thought content normal.          Assessment & Plan:  Physical exam. Labs reviewed. No major concerns. She is congratulated on substantial weight loss as above. She will continue with Weight Watchers. Schedule screening colonoscopy.

## 2015-06-10 NOTE — Progress Notes (Signed)
Pre visit review using our clinic review tool, if applicable. No additional management support is needed unless otherwise documented below in the visit note. 

## 2015-06-10 NOTE — Patient Instructions (Signed)
We will schedule GI follow up for colonoscopy.

## 2015-06-29 MED FILL — LEVOTHYROXINE 50 MCG TABLET: 50 | 90 days supply | Qty: 90 | Fill #1

## 2015-07-06 DIAGNOSIS — M1712 Unilateral primary osteoarthritis, left knee: Secondary | ICD-10-CM | POA: Diagnosis not present

## 2015-07-13 DIAGNOSIS — M1712 Unilateral primary osteoarthritis, left knee: Secondary | ICD-10-CM | POA: Diagnosis not present

## 2015-07-20 DIAGNOSIS — M1712 Unilateral primary osteoarthritis, left knee: Secondary | ICD-10-CM | POA: Diagnosis not present

## 2015-09-19 MED FILL — LEVOTHYROXINE 50 MCG TABLET: 50 | 90 days supply | Qty: 90 | Fill #2

## 2015-12-21 MED FILL — LEVOTHYROXINE 50 MCG TABLET: 50 | 30 days supply | Qty: 30 | Fill #3

## 2016-01-30 ENCOUNTER — Telehealth: Payer: Self-pay | Admitting: Family Medicine

## 2016-01-30 ENCOUNTER — Other Ambulatory Visit: Payer: Self-pay | Admitting: Family Medicine

## 2016-01-30 MED ORDER — LEVOTHYROXINE SODIUM 50 MCG PO TABS: 50.0000 ug | ORAL_TABLET | Freq: Every day | ORAL | 3 refills | Status: DC

## 2016-01-30 MED FILL — LEVOTHYROXINE 50 MCG TABLET: 50 | 30 days supply | Qty: 30 | Fill #0

## 2016-01-30 NOTE — Telephone Encounter (Signed)
OK to refill until February.

## 2016-01-30 NOTE — Telephone Encounter (Signed)
Medication refilled

## 2016-01-30 NOTE — Telephone Encounter (Signed)
CPE was 06/10/2015  Please advise

## 2016-01-30 NOTE — Telephone Encounter (Signed)
Pt had tsh done in Feb and wants to know if she need thyroid lab prior to  levothyroxine (SYNTHROID, LEVOTHROID) 50 MCG tablet  refill?  Pt only has 3 tabs left,.  [facility] outpt pharm

## 2016-02-27 MED FILL — LEVOTHYROXINE 50 MCG TABLET: 50 | 30 days supply | Qty: 30 | Fill #1

## 2016-03-29 MED FILL — LEVOTHYROXINE 50 MCG TABLET: 50 | 30 days supply | Qty: 30 | Fill #2

## 2016-04-30 MED FILL — LEVOTHYROXINE 50 MCG TABLET: 50 | 30 days supply | Qty: 30 | Fill #3

## 2016-05-29 MED FILL — LEVOTHYROXINE 50 MCG TABLET: 50 | 30 days supply | Qty: 30 | Fill #4

## 2016-06-13 ENCOUNTER — Encounter: Payer: Self-pay | Admitting: Family Medicine

## 2016-06-13 ENCOUNTER — Other Ambulatory Visit: Payer: Self-pay

## 2016-06-13 ENCOUNTER — Ambulatory Visit (INDEPENDENT_AMBULATORY_CARE_PROVIDER_SITE_OTHER): Payer: PRIVATE HEALTH INSURANCE | Admitting: Family Medicine

## 2016-06-13 VITALS — BP 110/80 | HR 64 | Ht 61.75 in | Wt 192.4 lb

## 2016-06-13 DIAGNOSIS — E038 Other specified hypothyroidism: Secondary | ICD-10-CM

## 2016-06-13 LAB — TSH: TSH: 8.16 u[IU]/mL — ABNORMAL HIGH (ref 0.35–4.50)

## 2016-06-13 MED ORDER — LEVOTHYROXINE SODIUM 75 MCG PO TABS: 75.0000 ug | ORAL_TABLET | Freq: Every day | ORAL | 3 refills | Status: DC

## 2016-06-13 MED FILL — LEVOTHYROXINE 75 MCG TABLET: 75 | 30 days supply | Qty: 30 | Fill #0

## 2016-06-13 NOTE — Progress Notes (Signed)
Pre visit review using our clinic review tool, if applicable. No additional management support is needed unless otherwise documented below in the visit note. 

## 2016-06-13 NOTE — Patient Instructions (Signed)
Hypothyroidism Hypothyroidism is a disorder of the thyroid. The thyroid is a large gland that is located in the lower front of the neck. The thyroid releases hormones that control how the body works. With hypothyroidism, the thyroid does not make enough of these hormones. What are the causes? Causes of hypothyroidism may include:  Viral infections.  Pregnancy.  Your own defense system (immune system) attacking your thyroid.  Certain medicines.  Birth defects.  Past radiation treatments to your head or neck.  Past treatment with radioactive iodine.  Past surgical removal of part or all of your thyroid.  Problems with the gland that is located in the center of your brain (pituitary).  What are the signs or symptoms? Signs and symptoms of hypothyroidism may include:  Feeling as though you have no energy (lethargy).  Inability to tolerate cold.  Weight gain that is not explained by a change in diet or exercise habits.  Dry skin.  Coarse hair.  Menstrual irregularity.  Slowing of thought processes.  Constipation.  Sadness or depression.  How is this diagnosed? Your health care provider may diagnose hypothyroidism with blood tests and ultrasound tests. How is this treated? Hypothyroidism is treated with medicine that replaces the hormones that your body does not make. After you begin treatment, it may take several weeks for symptoms to go away. Follow these instructions at home:  Take medicines only as directed by your health care provider.  If you start taking any new medicines, tell your health care provider.  Keep all follow-up visits as directed by your health care provider. This is important. As your condition improves, your dosage needs may change. You will need to have blood tests regularly so that your health care provider can watch your condition. Contact a health care provider if:  Your symptoms do not get better with treatment.  You are taking thyroid  replacement medicine and: ? You sweat excessively. ? You have tremors. ? You feel anxious. ? You lose weight rapidly. ? You cannot tolerate heat. ? You have emotional swings. ? You have diarrhea. ? You feel weak. Get help right away if:  You develop chest pain.  You develop an irregular heartbeat.  You develop a rapid heartbeat. This information is not intended to replace advice given to you by your health care provider. Make sure you discuss any questions you have with your health care provider. Document Released: 04/02/2005 Document Revised: 09/08/2015 Document Reviewed: 08/18/2013 Elsevier Interactive Patient Education  2017 Elsevier Inc.  

## 2016-06-13 NOTE — Progress Notes (Signed)
Subjective:     Patient ID: Savannah Summers, female   DOB: 05/17/1965, 51 y.o.   MRN: [id number]  HPI Patient seen for follow-up regarding hypothyroidism. Last year she went to Weight Watchers and lost about 50 pounds. She has unfortunately gained about 30 some pounds back. She recently started back Weight Watchers. She has hypothyroidism and takes levothyroxine 50 g daily..    Past Medical History:  Diagnosis Date  . Allergy   . Chicken pox    Past Surgical History:  Procedure Laterality Date  . ABDOMINAL HYSTERECTOMY    . BREAST SURGERY     breast reduction    reports that she has never smoked. She has never used smokeless tobacco. She reports that she drinks alcohol. She reports that she does not use drugs. family history includes Arthritis in her mother and sister; Cancer in her other; Hypothyroidism in her mother; Pulmonary fibrosis in her mother; Rheum arthritis in her mother and sister. No Known Allergies   Review of Systems  Constitutional: Negative for appetite change and unexpected weight change.  Respiratory: Negative for shortness of breath.   Cardiovascular: Negative for chest pain.  Neurological: Negative for dizziness and weakness.       Objective:   Physical Exam  Constitutional: She appears well-developed and well-nourished. No distress.  Neck: Neck supple. No thyromegaly present.  Cardiovascular: Normal rate and regular rhythm.   Pulmonary/Chest: Effort normal and breath sounds normal. No respiratory distress. She has no wheezes. She has no rales.  Musculoskeletal: She exhibits no edema.  Lymphadenopathy:    She has no cervical adenopathy.       Assessment:     Hypothyroidism-remains on Synthroid 50 g daily    Plan:     -Check TSH -She is encouraged to get back on Weight Watchers and lose some weight  Kristian CoveyBruce W Etty Isaac MD [facility]

## 2016-07-09 MED FILL — LEVOTHYROXINE 75 MCG TABLET: 75 | 30 days supply | Qty: 30 | Fill #1

## 2016-08-07 MED FILL — LEVOTHYROXINE 75 MCG TABLET: 75 | 30 days supply | Qty: 30 | Fill #2

## 2016-09-11 MED FILL — LEVOTHYROXINE 75 MCG TABLET: 75 | 30 days supply | Qty: 30 | Fill #3

## 2016-10-04 MED FILL — LEVOTHYROXINE 75 MCG TABLET: 75 | 30 days supply | Qty: 30 | Fill #4

## 2016-11-08 MED FILL — LEVOTHYROXINE 75 MCG TABLET: 75 | 30 days supply | Qty: 30 | Fill #5

## 2016-12-10 MED FILL — LEVOTHYROXINE 75 MCG TABLET: 75 | 30 days supply | Qty: 30 | Fill #6

## 2016-12-13 ENCOUNTER — Ambulatory Visit (INDEPENDENT_AMBULATORY_CARE_PROVIDER_SITE_OTHER): Payer: PRIVATE HEALTH INSURANCE | Admitting: Family Medicine

## 2016-12-13 ENCOUNTER — Encounter: Payer: Self-pay | Admitting: Family Medicine

## 2016-12-13 VITALS — BP 106/79 | HR 69 | Temp 98.3°F | Ht 61.75 in | Wt 196.0 lb

## 2016-12-13 DIAGNOSIS — L0292 Furuncle, unspecified: Secondary | ICD-10-CM

## 2016-12-13 MED ORDER — DOXYCYCLINE HYCLATE 100 MG PO CAPS: 100.0000 mg | ORAL_CAPSULE | Freq: Two times a day (BID) | ORAL | 0 refills | Status: AC

## 2016-12-13 MED FILL — DOXYCYCLINE HYCLATE 100 MG: 100 | 10 days supply | Qty: 20 | Fill #0

## 2016-12-13 NOTE — Patient Instructions (Signed)
WE NOW OFFER   [facility] FAST TRACK!!!  SAME DAY Appointments for ACUTE CARE  Such as: Sprains, Injuries, cuts, abrasions, rashes, muscle pain, joint pain, back pain Colds, flu, sore throats, headache, allergies, cough, fever  Ear pain, sinus and eye infections Abdominal pain, nausea, vomiting, diarrhea, upset stomach Animal/insect bites  3 Easy Ways to Schedule: Walk-In Scheduling Call in scheduling Mychart Sign-up: [email or web address]         

## 2016-12-13 NOTE — Progress Notes (Signed)
   Subjective:    Patient ID: Savannah Summers, female    DOB: 08/19/1965, 51 y.o.   MRN: [id number]  HPI Here for 4 days of a tender spot on the right temple. This appeared to be a small pimple at first and she was able to express as small amount of white DC from it. Then it grew a little larger. No fever.    Review of Systems  Constitutional: Negative.   Respiratory: Negative.   Cardiovascular: Negative.   Skin: Positive for wound.       Objective:   Physical Exam  Constitutional: She appears well-developed and well-nourished.  Cardiovascular: Normal rate, regular rhythm, normal heart sounds and intact distal pulses.   Pulmonary/Chest: Effort normal and breath sounds normal. No respiratory distress. She has no wheezes. She has no rales.  Skin:  Small boil on the right temple about 1 cm from the lateral corner of the eye          Assessment & Plan:  Boil, use warm compresses. Treat with Doxycycline.  Gershon CraneStephen Maverik Foot, MD

## 2017-01-03 ENCOUNTER — Encounter: Payer: Self-pay | Admitting: Family Medicine

## 2017-01-08 MED FILL — LEVOTHYROXINE 75 MCG TABLET: 75 | 30 days supply | Qty: 30 | Fill #7

## 2017-02-05 MED FILL — LEVOTHYROXINE 75 MCG TABLET: 75 | 30 days supply | Qty: 30 | Fill #8

## 2017-03-04 MED FILL — LEVOTHYROXINE 75 MCG TABLET: 75 | 30 days supply | Qty: 30 | Fill #9

## 2017-04-02 MED FILL — LEVOTHYROXINE 75 MCG TABLET: 75 | 30 days supply | Qty: 30 | Fill #10

## 2017-05-02 MED FILL — LEVOTHYROXINE 75 MCG TABLET: 75 | 30 days supply | Qty: 30 | Fill #11

## 2017-05-14 ENCOUNTER — Other Ambulatory Visit: Payer: Self-pay | Admitting: Family Medicine

## 2017-05-14 DIAGNOSIS — Z1231 Encounter for screening mammogram for malignant neoplasm of breast: Secondary | ICD-10-CM

## 2017-05-22 ENCOUNTER — Ambulatory Visit: Payer: PRIVATE HEALTH INSURANCE | Admitting: Family Medicine

## 2017-05-22 ENCOUNTER — Encounter: Payer: Self-pay | Admitting: Family Medicine

## 2017-05-22 VITALS — BP 110/70 | HR 62 | Temp 98.3°F | Ht 61.0 in | Wt 196.4 lb

## 2017-05-22 DIAGNOSIS — E038 Other specified hypothyroidism: Secondary | ICD-10-CM

## 2017-05-22 LAB — TSH: TSH: 2.83 u[IU]/mL (ref 0.35–4.50)

## 2017-05-22 MED ORDER — LEVOTHYROXINE SODIUM 75 MCG PO TABS: 75.0000 ug | ORAL_TABLET | Freq: Every day | ORAL | 3 refills | Status: DC

## 2017-05-22 NOTE — Patient Instructions (Signed)
Hypothyroidism Hypothyroidism is a disorder of the thyroid. The thyroid is a large gland that is located in the lower front of the neck. The thyroid releases hormones that control how the body works. With hypothyroidism, the thyroid does not make enough of these hormones. What are the causes? Causes of hypothyroidism may include:  Viral infections.  Pregnancy.  Your own defense system (immune system) attacking your thyroid.  Certain medicines.  Birth defects.  Past radiation treatments to your head or neck.  Past treatment with radioactive iodine.  Past surgical removal of part or all of your thyroid.  Problems with the gland that is located in the center of your brain (pituitary).  What are the signs or symptoms? Signs and symptoms of hypothyroidism may include:  Feeling as though you have no energy (lethargy).  Inability to tolerate cold.  Weight gain that is not explained by a change in diet or exercise habits.  Dry skin.  Coarse hair.  Menstrual irregularity.  Slowing of thought processes.  Constipation.  Sadness or depression.  How is this diagnosed? Your health care provider may diagnose hypothyroidism with blood tests and ultrasound tests. How is this treated? Hypothyroidism is treated with medicine that replaces the hormones that your body does not make. After you begin treatment, it may take several weeks for symptoms to go away. Follow these instructions at home:  Take medicines only as directed by your health care provider.  If you start taking any new medicines, tell your health care provider.  Keep all follow-up visits as directed by your health care provider. This is important. As your condition improves, your dosage needs may change. You will need to have blood tests regularly so that your health care provider can watch your condition. Contact a health care provider if:  Your symptoms do not get better with treatment.  You are taking thyroid  replacement medicine and: ? You sweat excessively. ? You have tremors. ? You feel anxious. ? You lose weight rapidly. ? You cannot tolerate heat. ? You have emotional swings. ? You have diarrhea. ? You feel weak. Get help right away if:  You develop chest pain.  You develop an irregular heartbeat.  You develop a rapid heartbeat. This information is not intended to replace advice given to you by your health care provider. Make sure you discuss any questions you have with your health care provider. Document Released: 04/02/2005 Document Revised: 09/08/2015 Document Reviewed: 08/18/2013 Elsevier Interactive Patient Education  2018 Elsevier Inc.  

## 2017-05-22 NOTE — Addendum Note (Signed)
Addended by: Kristian CoveyBURCHETTE, BRUCE W on: 05/22/2017 01:13 PM   Modules accepted: Orders

## 2017-05-22 NOTE — Progress Notes (Signed)
Subjective:     Patient ID: Savannah Summers, female   DOB: 01/24/1966, 52 y.o.   MRN: [id number]  HPI Patient here for follow-up hypothyroidism. She is on levothyroxine 75 g daily. Her TSH was up last year. We recommended 3 month follow-up but she never came back for that. She feels good overall. Weight is relatively stable. She has had poor compliance with diet at times. Compliant with medication. No recent reported hair loss. No constipation issues. Occasional fatigue. She is getting yearly physicals with GYN  Past Medical History:  Diagnosis Date  . Allergy   . Chicken pox    Past Surgical History:  Procedure Laterality Date  . ABDOMINAL HYSTERECTOMY    . BREAST SURGERY     breast reduction    reports that  has never smoked. she has never used smokeless tobacco. She reports that she drinks alcohol. She reports that she does not use drugs. family history includes Arthritis in her mother and sister; Cancer in her other; Hypothyroidism in her mother; Pulmonary fibrosis in her mother; Rheum arthritis in her mother and sister. No Known Allergies   Review of Systems  Constitutional: Negative for fatigue and unexpected weight change.  Eyes: Negative for visual disturbance.  Respiratory: Negative for cough, chest tightness, shortness of breath and wheezing.   Cardiovascular: Negative for chest pain, palpitations and leg swelling.  Endocrine: Negative for cold intolerance and heat intolerance.  Neurological: Negative for dizziness, seizures, syncope, weakness, light-headedness and headaches.       Objective:   Physical Exam  Constitutional: She appears well-developed and well-nourished.  Neck: Neck supple. No thyromegaly present.  Cardiovascular: Normal rate and regular rhythm.  Pulmonary/Chest: Effort normal and breath sounds normal. No respiratory distress. She has no wheezes. She has no rales.       Assessment:     Hypothyroidism    Plan:     -Recheck TSH and adjust  medication as indicated -We'll plan on refilling medication after her labs are back  Kristian CoveyBruce W Rosalind Guido MD [facility]

## 2017-05-30 ENCOUNTER — Ambulatory Visit
Admission: RE | Admit: 2017-05-30 | Discharge: 2017-05-30 | Disposition: A | Discharge status: Home / Self Care | Payer: PRIVATE HEALTH INSURANCE | Source: Ambulatory Visit | Attending: Family Medicine | Admitting: Family Medicine

## 2017-05-30 DIAGNOSIS — Z1231 Encounter for screening mammogram for malignant neoplasm of breast: Secondary | ICD-10-CM

## 2018-04-03 NOTE — H&P (Signed)
TOTAL KNEE ADMISSION H&P  Patient is being admitted for left total knee arthroplasty.  Subjective:  Chief Complaint:left knee pain.  HPI: Savannah Summers, 52 y.o. female, has a history of pain and functional disability in the left knee due to arthritis and has failed non-surgical conservative treatments for greater than 12 weeks to includecorticosteriod injections, viscosupplementation injections and activity modification.  Onset of symptoms was gradual, starting 5 years ago with gradually worsening course since that time. The patient noted no past surgery on the left knee(s).  Patient currently rates pain in the left knee(s) at 6 out of 10 with activity. Patient has worsening of pain with activity and weight bearing, crepitus and weakness.  Patient has evidence of bone-on-bone arthritis in the medial and patellofemoral compartments of the left knee and significant medial joint space narrowing on the right knee by imaging studies. There is no active infection.  Patient Active Problem List   Diagnosis Date Noted  . Obesity (BMI 30-39.9) 12/19/2012  . Hypothyroid 12/14/2011  . Onychomycosis 12/14/2011   Past Medical History:  Diagnosis Date  . Allergy   . Chicken pox     Past Surgical History:  Procedure Laterality Date  . ABDOMINAL HYSTERECTOMY    . BREAST SURGERY     breast reduction    No current facility-administered medications for this encounter.    Current Outpatient Medications  Medication Sig Dispense Refill Last Dose  . Glucosamine-Chondroit-Vit C-Mn (GLUCOSAMINE 1500 COMPLEX PO) Take 1,500 mg by mouth daily. Takes 2 tablets daily.    Taking  . levothyroxine (SYNTHROID, LEVOTHROID) 75 MCG tablet Take 1 tablet (75 mcg total) by mouth daily. 90 tablet 3    No Known Allergies  Social History   Tobacco Use  . Smoking status: Never Smoker  . Smokeless tobacco: Never Used  Substance Use Topics  . Alcohol use: Yes    Family History  Problem Relation Age of Onset  . Rheum  arthritis Mother   . Pulmonary fibrosis Mother   . Arthritis Mother        RA  . Hypothyroidism Mother   . Rheum arthritis Sister   . Arthritis Sister        RA  . Cancer Other        breast  . Breast cancer Neg Hx      Review of Systems  Constitutional: Negative for chills and fever.  HENT: Negative for congestion, sore throat and tinnitus.   Eyes: Negative for double vision, photophobia and pain.  Respiratory: Negative for cough, shortness of breath and wheezing.   Cardiovascular: Negative for chest pain, palpitations and orthopnea.  Gastrointestinal: Negative for heartburn, nausea and vomiting.  Genitourinary: Negative for dysuria, frequency and urgency.  Musculoskeletal: Positive for joint pain.  Neurological: Negative for dizziness, weakness and headaches.    Objective:  Physical Exam  Well nourished and well developed.  General: Alert and oriented x3, cooperative and pleasant, no acute distress.  Head: normocephalic, atraumatic, neck supple.  Eyes: EOMI.  Respiratory: breath sounds clear in all fields, no wheezing, rales, or rhonchi. Cardiovascular: Regular rate and rhythm, no murmurs, gallops or rubs.  Abdomen: non-tender to palpation and soft, normoactive bowel sounds. Musculoskeletal: Left Knee Exam: No effusion. Range of motion is 0-125 degrees. Moderate crepitus on range of motion of the knee. Positive medial joint line tenderness. No lateral joint line tenderness. Stable knee. Calves soft and nontender. Motor function intact in LE. Strength 5/5 LE bilaterally. Neuro: Distal pulses 2+. Sensation to  light touch intact in LE.  Vital signs in last 24 hours: Blood pressure: 132/92 mmHg  Labs:   Estimated body mass index is 37.11 kg/m as calculated from the following:   Height as of 05/22/17: 5\' 1"  (1.549 m).   Weight as of 05/22/17: 89.1 kg.   Imaging Review Plain radiographs demonstrate severe degenerative joint disease of the left knee(s). The overall  alignment isneutral. The bone quality appears to be adequate for age and reported activity level.   Preoperative templating of the joint replacement has been completed, documented, and submitted to the Operating Room personnel in order to optimize intra-operative equipment management.   Anticipated LOS equal to or greater than 2 midnights due to - Age 54 and older with one or more of the following:  - Obesity  - Expected need for hospital services (PT, OT, Nursing) required for safe  discharge  - Anticipated need for postoperative skilled nursing care or inpatient rehab  - Active co-morbidities: None OR   - Unanticipated findings during/Post Surgery: None  - Patient is a high risk of re-admission due to: None     Assessment/Plan:  End stage arthritis, left knee   The patient history, physical examination, clinical judgment of the provider and imaging studies are consistent with end stage degenerative joint disease of the left knee(s) and total knee arthroplasty is deemed medically necessary. The treatment options including medical management, injection therapy arthroscopy and arthroplasty were discussed at length. The risks and benefits of total knee arthroplasty were presented and reviewed. The risks due to aseptic loosening, infection, stiffness, patella tracking problems, thromboembolic complications and other imponderables were discussed. The patient acknowledged the explanation, agreed to proceed with the plan and consent was signed. Patient is being admitted for inpatient treatment for surgery, pain control, PT, OT, prophylactic antibiotics, VTE prophylaxis, progressive ambulation and ADL's and discharge planning. The patient is planning to be discharged home.    Therapy [facility] Disposition: Home with husband Planned DVT Prophylaxis: Aspirin 325 mg BID DME needed: None PCP: Dr. Caryl NeverBurchette TXA: IV Allergies: NKDA Anesthesia Concerns:  Nausea/vomiting BMI: 37.8  - Patient was instructed on what medications to stop prior to surgery. - Follow-up visit in 2 weeks with Dr. Lequita HaltAluisio - Begin physical therapy following surgery - Pre-operative lab work as pre-surgical testing - Prescriptions will be provided in hospital at time of discharge  Arther AbbottKristie Katilynn Sinkler, PA-C [facility]

## 2018-04-10 NOTE — Patient Instructions (Addendum)
Doylene Canardicole M Horgan  04/10/2018   Your procedure is scheduled on: 04-21-18   Report to [facility] Main  Entrance    Report to admitting at 5:30AM    Call this number if you have problems the morning of surgery [phone]      Remember: Do not eat food or drink liquids :After Midnight. BRUSH YOUR TEETH MORNING OF SURGERY AND RINSE YOUR MOUTH OUT, NO CHEWING GUM CANDY OR MINTS.     Take these medicines the morning of surgery with A SIP OF WATER: levothyroxine(synthroid)                                 You may not have any metal on your body including hair pins and              piercings  Do not wear jewelry, make-up, lotions, powders or perfumes, deodorant             Do not wear nail polish.  Do not shave  48 hours prior to surgery.                 Do not bring valuables to the hospital. [facility] IS NOT             RESPONSIBLE   FOR VALUABLES.  Contacts, dentures or bridgework may not be worn into surgery.  Leave suitcase in the car. After surgery it may be brought to your room.     Patients discharged the day of surgery will not be allowed to drive home.  Name and phone number of your driver:  Special Instructions: N/A              Please read over the following fact sheets you were given: _____________________________________________________________________             [facility] - Preparing for Surgery Before surgery, you can play an important role.  Because skin is not sterile, your skin needs to be as free of germs as possible.  You can reduce the number of germs on your skin by washing with CHG (chlorahexidine gluconate) soap before surgery.  CHG is an antiseptic cleaner which kills germs and bonds with the skin to continue killing germs even after washing. Please DO NOT use if you have an allergy to CHG or antibacterial soaps.  If your skin becomes reddened/irritated stop using the CHG and inform your nurse when you arrive at Short Stay. Do not  shave (including legs and underarms) for at least 48 hours prior to the first CHG shower.  You may shave your face/neck. Please follow these instructions carefully:  1.  Shower with CHG Soap the night before surgery and the  morning of Surgery.  2.  If you choose to wash your hair, wash your hair first as usual with your  normal  shampoo.  3.  After you shampoo, rinse your hair and body thoroughly to remove the  shampoo.                           4.  Use CHG as you would any other liquid soap.  You can apply chg directly  to the skin and wash  Gently with a scrungie or clean washcloth.  5.  Apply the CHG Soap to your body ONLY FROM THE NECK DOWN.   Do not use on face/ open                           Wound or open sores. Avoid contact with eyes, ears mouth and genitals (private parts).                       Wash face,  Genitals (private parts) with your normal soap.             6.  Wash thoroughly, paying special attention to the area where your surgery  will be performed.  7.  Thoroughly rinse your body with warm water from the neck down.  8.  DO NOT shower/wash with your normal soap after using and rinsing off  the CHG Soap.                9.  Pat yourself dry with a clean towel.            10.  Wear clean pajamas.            11.  Place clean sheets on your bed the night of your first shower and do not  sleep with pets. Day of Surgery : Do not apply any lotions/deodorants the morning of surgery.  Please wear clean clothes to the hospital/surgery center.  FAILURE TO FOLLOW THESE INSTRUCTIONS MAY RESULT IN THE CANCELLATION OF YOUR SURGERY PATIENT SIGNATURE_________________________________  NURSE SIGNATURE__________________________________  ________________________________________________________________________   Adam Phenix  An incentive spirometer is a tool that can help keep your lungs clear and active. This tool measures how well you are filling your lungs  with each breath. Taking long deep breaths may help reverse or decrease the chance of developing breathing (pulmonary) problems (especially infection) following:  A long period of time when you are unable to move or be active. BEFORE THE PROCEDURE   If the spirometer includes an indicator to show your best effort, your nurse or respiratory therapist will set it to a desired goal.  If possible, sit up straight or lean slightly forward. Try not to slouch.  Hold the incentive spirometer in an upright position. INSTRUCTIONS FOR USE  1. Sit on the edge of your bed if possible, or sit up as far as you can in bed or on a chair. 2. Hold the incentive spirometer in an upright position. 3. Breathe out normally. 4. Place the mouthpiece in your mouth and seal your lips tightly around it. 5. Breathe in slowly and as deeply as possible, raising the piston or the ball toward the top of the column. 6. Hold your breath for 3-5 seconds or for as long as possible. Allow the piston or ball to fall to the bottom of the column. 7. Remove the mouthpiece from your mouth and breathe out normally. 8. Rest for a few seconds and repeat Steps 1 through 7 at least 10 times every 1-2 hours when you are awake. Take your time and take a few normal breaths between deep breaths. 9. The spirometer may include an indicator to show your best effort. Use the indicator as a goal to work toward during each repetition. 10. After each set of 10 deep breaths, practice coughing to be sure your lungs are clear. If you have an incision (the cut made at the time of surgery),  support your incision when coughing by placing a pillow or rolled up towels firmly against it. Once you are able to get out of bed, walk around indoors and cough well. You may stop using the incentive spirometer when instructed by your caregiver.  RISKS AND COMPLICATIONS  Take your time so you do not get dizzy or light-headed.  If you are in pain, you may need to  take or ask for pain medication before doing incentive spirometry. It is harder to take a deep breath if you are having pain. AFTER USE  Rest and breathe slowly and easily.  It can be helpful to keep track of a log of your progress. Your caregiver can provide you with a simple table to help with this. If you are using the spirometer at home, follow these instructions: [place] IF:   You are having difficultly using the spirometer.  You have trouble using the spirometer as often as instructed.  Your pain medication is not giving enough relief while using the spirometer.  You develop fever of 100.5 F (38.1 C) or higher. SEEK IMMEDIATE MEDICAL CARE IF:   You cough up bloody sputum that had not been present before.  You develop fever of 102 F (38.9 C) or greater.  You develop worsening pain at or near the incision site. MAKE SURE YOU:   Understand these instructions.  Will watch your condition.  Will get help right away if you are not doing well or get worse. Document Released: 08/13/2006 Document Revised: 06/25/2011 Document Reviewed: 10/14/2006 ExitCare Patient Information 2014 ExitCare, [place].   ________________________________________________________________________  WHAT IS A BLOOD TRANSFUSION? Blood Transfusion Information  A transfusion is the replacement of blood or some of its parts. Blood is made up of multiple cells which provide different functions.  Red blood cells carry oxygen and are used for blood loss replacement.  White blood cells fight against infection.  Platelets control bleeding.  Plasma helps clot blood.  Other blood products are available for specialized needs, such as hemophilia or other clotting disorders. BEFORE THE TRANSFUSION  Who gives blood for transfusions?   Healthy volunteers who are fully evaluated to make sure their blood is safe. This is blood bank blood. Transfusion therapy is the safest it has ever been in the  practice of medicine. Before blood is taken from a donor, a complete history is taken to make sure that person has no history of diseases nor engages in risky social behavior (examples are intravenous drug use or sexual activity with multiple partners). The donor's travel history is screened to minimize risk of transmitting infections, such as malaria. The donated blood is tested for signs of infectious diseases, such as HIV and hepatitis. The blood is then tested to be sure it is compatible with you in order to minimize the chance of a transfusion reaction. If you or a relative donates blood, this is often done in anticipation of surgery and is not appropriate for emergency situations. It takes many days to process the donated blood. RISKS AND COMPLICATIONS Although transfusion therapy is very safe and saves many lives, the main dangers of transfusion include:   Getting an infectious disease.  Developing a transfusion reaction. This is an allergic reaction to something in the blood you were given. Every precaution is taken to prevent this. The decision to have a blood transfusion has been considered carefully by your caregiver before blood is given. Blood is not given unless the benefits outweigh the risks. AFTER THE TRANSFUSION  Right after receiving a blood transfusion, you will usually feel much better and more energetic. This is especially true if your red blood cells have gotten low (anemic). The transfusion raises the level of the red blood cells which carry oxygen, and this usually causes an energy increase.  The nurse administering the transfusion will monitor you carefully for complications. HOME CARE INSTRUCTIONS  No special instructions are needed after a transfusion. You may find your energy is better. Speak with your caregiver about any limitations on activity for underlying diseases you may have. SEEK MEDICAL CARE IF:   Your condition is not improving after your transfusion.  You  develop redness or irritation at the intravenous (IV) site. SEEK IMMEDIATE MEDICAL CARE IF:  Any of the following symptoms occur over the next 12 hours:  Shaking chills.  You have a temperature by mouth above 102 F (38.9 C), not controlled by medicine.  Chest, back, or muscle pain.  People around you feel you are not acting correctly or are confused.  Shortness of breath or difficulty breathing.  Dizziness and fainting.  You get a rash or develop hives.  You have a decrease in urine output.  Your urine turns a dark color or changes to pink, red, or brown. Any of the following symptoms occur over the next 10 days:  You have a temperature by mouth above 102 F (38.9 C), not controlled by medicine.  Shortness of breath.  Weakness after normal activity.  The white part of the eye turns yellow (jaundice).  You have a decrease in the amount of urine or are urinating less often.  Your urine turns a dark color or changes to pink, red, or brown. Document Released: 03/30/2000 Document Revised: 06/25/2011 Document Reviewed: 11/17/2007 [facility] Patient Information 2014 [place].  ______________________________________________________________________

## 2018-04-11 ENCOUNTER — Encounter (HOSPITAL_COMMUNITY): Payer: Self-pay

## 2018-04-11 ENCOUNTER — Encounter (HOSPITAL_COMMUNITY)
Admission: RE | Admit: 2018-04-11 | Discharge: 2018-04-11 | Disposition: A | Discharge status: Home / Self Care | Payer: PRIVATE HEALTH INSURANCE | Source: Ambulatory Visit | Attending: Orthopedic Surgery | Admitting: Orthopedic Surgery

## 2018-04-11 ENCOUNTER — Other Ambulatory Visit: Payer: Self-pay

## 2018-04-11 ENCOUNTER — Encounter: Payer: Self-pay | Admitting: Family Medicine

## 2018-04-11 ENCOUNTER — Ambulatory Visit: Payer: PRIVATE HEALTH INSURANCE | Admitting: Family Medicine

## 2018-04-11 VITALS — BP 118/80 | HR 73 | Temp 97.9°F | Ht 61.0 in | Wt 197.3 lb

## 2018-04-11 DIAGNOSIS — E038 Other specified hypothyroidism: Secondary | ICD-10-CM

## 2018-04-11 DIAGNOSIS — Z01818 Encounter for other preprocedural examination: Secondary | ICD-10-CM

## 2018-04-11 DIAGNOSIS — M1712 Unilateral primary osteoarthritis, left knee: Secondary | ICD-10-CM | POA: Diagnosis not present

## 2018-04-11 DIAGNOSIS — Z01812 Encounter for preprocedural laboratory examination: Secondary | ICD-10-CM | POA: Diagnosis present

## 2018-04-11 HISTORY — DX: Hypothyroidism, unspecified: E03.9

## 2018-04-11 HISTORY — DX: Unspecified osteoarthritis, unspecified site: M19.90

## 2018-04-11 LAB — COMPREHENSIVE METABOLIC PANEL
ALT: 24 U/L (ref 0–44)
AST: 24 U/L (ref 15–41)
Albumin: 4.1 g/dL (ref 3.5–5.0)
Alkaline Phosphatase: 74 U/L (ref 38–126)
Anion gap: 7 (ref 5–15)
BUN: 13 mg/dL (ref 6–20)
CALCIUM: 9.3 mg/dL (ref 8.9–10.3)
CO2: 28 mmol/L (ref 22–32)
CREATININE: 0.92 mg/dL (ref 0.44–1.00)
Chloride: 103 mmol/L (ref 98–111)
GFR calc Af Amer: >60 mL/min (ref >60)
GFR calc non Af Amer: >60 mL/min (ref >60)
Glucose, Bld: 99 mg/dL (ref 70–99)
Potassium: 4 mmol/L (ref 3.5–5.1)
Sodium: 138 mmol/L (ref 135–145)
Total Bilirubin: 1.3 mg/dL — ABNORMAL HIGH (ref 0.3–1.2)
Total Protein: 7.3 g/dL (ref 6.5–8.1)

## 2018-04-11 LAB — CBC
HEMATOCRIT: 42.7 % (ref 36.0–46.0)
HEMOGLOBIN: 13.7 g/dL (ref 12.0–15.0)
MCH: 28.8 pg (ref 26.0–34.0)
MCHC: 32.1 g/dL (ref 30.0–36.0)
MCV: 89.7 fL (ref 80.0–100.0)
Platelets: 256 10*3/uL (ref 150–400)
RBC: 4.76 MIL/uL (ref 3.87–5.11)
RDW: 12.7 % (ref 11.5–15.5)
WBC: 4.8 10*3/uL (ref 4.0–10.5)
nRBC: 0 % (ref 0.0–0.2)

## 2018-04-11 LAB — ABO/RH: ABO/RH(D): O POS

## 2018-04-11 LAB — TSH: TSH: 4.62 u[IU]/mL — ABNORMAL HIGH (ref 0.35–4.50)

## 2018-04-11 LAB — PROTIME-INR
INR: 0.98
Prothrombin Time: 12.9 seconds (ref 11.4–15.2)

## 2018-04-11 LAB — APTT: aPTT: 27 seconds (ref 24–36)

## 2018-04-11 LAB — SURGICAL PCR SCREEN
MRSA, PCR: NEGATIVE
STAPHYLOCOCCUS AUREUS: NEGATIVE

## 2018-04-11 NOTE — Progress Notes (Signed)
  Subjective:     Patient ID: Savannah Summers, female   DOB: 05/11/1965, 52 y.o.   MRN: [id number]  HPI Patient is seen for preoperative clearance for left total knee replacement.  This has proposed date of surgery January 6.  Patient has history of hypothyroidism.  No history of hypertension.  No history of CAD.  No chronic lung issues.  Non-smoker.  She denies any recent chest pains.  No dyspnea.  No family history of premature CAD.  She has hypothyroidism and is on replacement with levothyroxine 75 mcg daily.  Past Medical History:  Diagnosis Date  . Allergy   . Arthritis   . Chicken pox   . Hypothyroidism    Past Surgical History:  Procedure Laterality Date  . ABDOMINAL HYSTERECTOMY    . BREAST SURGERY     breast reduction    reports that she has never smoked. She has never used smokeless tobacco. She reports current alcohol use. She reports that she does not use drugs. family history includes Arthritis in her mother and sister; Cancer in an other family member; Hypothyroidism in her mother; Pulmonary fibrosis in her mother; Rheum arthritis in her mother and sister. No Known Allergies   Review of Systems  Constitutional: Negative for fatigue.  Eyes: Negative for visual disturbance.  Respiratory: Negative for cough, chest tightness, shortness of breath and wheezing.   Cardiovascular: Negative for chest pain, palpitations and leg swelling.  Neurological: Negative for dizziness, seizures, syncope, weakness, light-headedness and headaches.       Objective:   Physical Exam Constitutional:      Appearance: She is well-developed.  Eyes:     Pupils: Pupils are equal, round, and reactive to light.  Neck:     Musculoskeletal: Neck supple.     Thyroid: No thyromegaly.     Vascular: No JVD.  Cardiovascular:     Rate and Rhythm: Normal rate and regular rhythm.     Heart sounds: No gallop.   Pulmonary:     Effort: Pulmonary effort is normal. No respiratory distress.     Breath  sounds: Normal breath sounds. No wheezing or rales.  Neurological:     Mental Status: She is alert.        Assessment:     #1 preoperative clearance for left total knee replacement  #2 hypothyroidism-on replacement    Plan:     -Check EKG-NSR with no acute changes. -Repeat TSH -Patient had preoperative labs this morning and these were reviewed with patient with no concerns -no known contraindications for surgery at this time.    Kristian CoveyBruce W Derian Pfost MD [facility]

## 2018-04-13 ENCOUNTER — Encounter: Payer: Self-pay | Admitting: Family Medicine

## 2018-04-20 MED ORDER — BUPIVACAINE LIPOSOME 1.3 % IJ SUSP
20.0000 mL | Freq: Once | INTRAMUSCULAR | Status: DC
  Filled 2018-04-20: qty 20

## 2018-04-21 ENCOUNTER — Encounter (HOSPITAL_COMMUNITY)
Admission: RE | Disposition: A | Discharge status: Home / Self Care | Payer: Self-pay | Source: Home / Self Care | Attending: Orthopedic Surgery

## 2018-04-21 ENCOUNTER — Encounter (HOSPITAL_COMMUNITY): Payer: Self-pay | Admitting: *Deleted

## 2018-04-21 ENCOUNTER — Inpatient Hospital Stay (HOSPITAL_COMMUNITY): Payer: PRIVATE HEALTH INSURANCE | Admitting: Physician Assistant

## 2018-04-21 ENCOUNTER — Inpatient Hospital Stay (HOSPITAL_COMMUNITY)
Admission: RE | Admit: 2018-04-21 | Discharge: 2018-04-22 | DRG: 470 | Disposition: A | Discharge status: Home / Self Care | Payer: PRIVATE HEALTH INSURANCE | Attending: Orthopedic Surgery | Admitting: Orthopedic Surgery

## 2018-04-21 ENCOUNTER — Inpatient Hospital Stay (HOSPITAL_COMMUNITY): Payer: PRIVATE HEALTH INSURANCE | Admitting: Anesthesiology

## 2018-04-21 ENCOUNTER — Other Ambulatory Visit: Payer: Self-pay

## 2018-04-21 DIAGNOSIS — Z6837 Body mass index (BMI) 37.0-37.9, adult: Secondary | ICD-10-CM | POA: Diagnosis not present

## 2018-04-21 DIAGNOSIS — Z79899 Other long term (current) drug therapy: Secondary | ICD-10-CM

## 2018-04-21 DIAGNOSIS — M179 Osteoarthritis of knee, unspecified: Secondary | ICD-10-CM | POA: Diagnosis present

## 2018-04-21 DIAGNOSIS — E039 Hypothyroidism, unspecified: Secondary | ICD-10-CM | POA: Diagnosis present

## 2018-04-21 DIAGNOSIS — M1712 Unilateral primary osteoarthritis, left knee: Principal | ICD-10-CM | POA: Diagnosis present

## 2018-04-21 DIAGNOSIS — E669 Obesity, unspecified: Secondary | ICD-10-CM | POA: Diagnosis present

## 2018-04-21 DIAGNOSIS — M1710 Unilateral primary osteoarthritis, unspecified knee: Secondary | ICD-10-CM | POA: Diagnosis present

## 2018-04-21 HISTORY — PX: TOTAL KNEE ARTHROPLASTY: SHX125

## 2018-04-21 LAB — TYPE AND SCREEN
ABO/RH(D): O POS
Antibody Screen: NEGATIVE

## 2018-04-21 SURGERY — ARTHROPLASTY, KNEE, TOTAL
Anesthesia: Spinal | Laterality: Left

## 2018-04-21 MED ORDER — DOCUSATE SODIUM 100 MG PO CAPS
100.0000 mg | ORAL_CAPSULE | Freq: Two times a day (BID) | ORAL | Status: DC
  Administered 2018-04-21 – 2018-04-22 (×2): 100 mg via ORAL
  Filled 2018-04-21 (×2): qty 1

## 2018-04-21 MED ORDER — MIDAZOLAM HCL 2 MG/2ML IJ SOLN
INTRAMUSCULAR | Status: AC
  Filled 2018-04-21: qty 2

## 2018-04-21 MED ORDER — PHENOL 1.4 % MT LIQD: 1.0000 | OROMUCOSAL | Status: DC | PRN

## 2018-04-21 MED ORDER — LEVOTHYROXINE SODIUM 75 MCG PO TABS
75.0000 ug | ORAL_TABLET | Freq: Every day | ORAL | Status: DC
  Administered 2018-04-22: 75 ug via ORAL
  Filled 2018-04-21: qty 1

## 2018-04-21 MED ORDER — MORPHINE SULFATE (PF) 2 MG/ML IV SOLN: 1.0000 mg | INTRAVENOUS | Status: DC | PRN

## 2018-04-21 MED ORDER — PROPOFOL 500 MG/50ML IV EMUL
INTRAVENOUS | Status: DC | PRN
  Administered 2018-04-21: 100 ug/kg/min via INTRAVENOUS

## 2018-04-21 MED ORDER — METOCLOPRAMIDE HCL 5 MG/ML IJ SOLN: 5.0000 mg | Freq: Three times a day (TID) | INTRAMUSCULAR | Status: DC | PRN

## 2018-04-21 MED ORDER — ONDANSETRON HCL 4 MG/2ML IJ SOLN: 4.0000 mg | Freq: Four times a day (QID) | INTRAMUSCULAR | Status: DC | PRN

## 2018-04-21 MED ORDER — PHENYLEPHRINE HCL 10 MG/ML IJ SOLN
INTRAMUSCULAR | Status: AC
  Filled 2018-04-21: qty 1

## 2018-04-21 MED ORDER — METHOCARBAMOL 500 MG PO TABS
500.0000 mg | ORAL_TABLET | Freq: Four times a day (QID) | ORAL | Status: DC | PRN
  Administered 2018-04-21 – 2018-04-22 (×2): 500 mg via ORAL
  Filled 2018-04-21 (×2): qty 1

## 2018-04-21 MED ORDER — SODIUM CHLORIDE 0.9 % IV SOLN
INTRAVENOUS | Status: DC
  Administered 2018-04-21: 14:00:00 via INTRAVENOUS

## 2018-04-21 MED ORDER — SODIUM CHLORIDE (PF) 0.9 % IJ SOLN
INTRAMUSCULAR | Status: AC
  Filled 2018-04-21: qty 10

## 2018-04-21 MED ORDER — POLYETHYLENE GLYCOL 3350 17 G PO PACK: 17.0000 g | PACK | Freq: Every day | ORAL | Status: DC | PRN

## 2018-04-21 MED ORDER — TRANEXAMIC ACID-NACL 1000-0.7 MG/100ML-% IV SOLN
1000.0000 mg | INTRAVENOUS | Status: AC
  Administered 2018-04-21: 1000 mg via INTRAVENOUS

## 2018-04-21 MED ORDER — ACETAMINOPHEN 500 MG PO TABS
1000.0000 mg | ORAL_TABLET | Freq: Four times a day (QID) | ORAL | Status: AC
  Administered 2018-04-21 – 2018-04-22 (×4): 1000 mg via ORAL
  Filled 2018-04-21 (×4): qty 2

## 2018-04-21 MED ORDER — LIP MEDEX EX OINT
TOPICAL_OINTMENT | CUTANEOUS | Status: AC
  Filled 2018-04-21: qty 7

## 2018-04-21 MED ORDER — HYDROMORPHONE HCL 1 MG/ML IJ SOLN: 0.2500 mg | INTRAMUSCULAR | Status: DC | PRN

## 2018-04-21 MED ORDER — FENTANYL CITRATE (PF) 100 MCG/2ML IJ SOLN
INTRAMUSCULAR | Status: AC
  Filled 2018-04-21: qty 2

## 2018-04-21 MED ORDER — BUPIVACAINE IN DEXTROSE 0.75-8.25 % IT SOLN
INTRATHECAL | Status: DC | PRN
  Administered 2018-04-21: 1.6 mL via INTRATHECAL

## 2018-04-21 MED ORDER — SODIUM CHLORIDE (PF) 0.9 % IJ SOLN
INTRAMUSCULAR | Status: DC | PRN
  Administered 2018-04-21: 60 mL

## 2018-04-21 MED ORDER — FENTANYL CITRATE (PF) 100 MCG/2ML IJ SOLN
INTRAMUSCULAR | Status: DC | PRN
  Administered 2018-04-21 (×2): 50 ug via INTRAVENOUS

## 2018-04-21 MED ORDER — SODIUM CHLORIDE 0.9 % IR SOLN
Status: DC | PRN
  Administered 2018-04-21: 1000 mL

## 2018-04-21 MED ORDER — TRAMADOL HCL 50 MG PO TABS
50.0000 mg | ORAL_TABLET | Freq: Four times a day (QID) | ORAL | Status: DC | PRN
  Administered 2018-04-22: 100 mg via ORAL
  Filled 2018-04-21: qty 2

## 2018-04-21 MED ORDER — GABAPENTIN 300 MG PO CAPS
ORAL_CAPSULE | ORAL | Status: AC
  Filled 2018-04-21: qty 1

## 2018-04-21 MED ORDER — ASPIRIN EC 325 MG PO TBEC
325.0000 mg | DELAYED_RELEASE_TABLET | Freq: Two times a day (BID) | ORAL | Status: DC
  Administered 2018-04-22: 325 mg via ORAL
  Filled 2018-04-21: qty 1

## 2018-04-21 MED ORDER — CEFAZOLIN SODIUM-DEXTROSE 2-4 GM/100ML-% IV SOLN
2.0000 g | Freq: Four times a day (QID) | INTRAVENOUS | Status: AC
  Administered 2018-04-21 (×2): 2 g via INTRAVENOUS
  Filled 2018-04-21 (×2): qty 100

## 2018-04-21 MED ORDER — CLONIDINE HCL (ANALGESIA) 100 MCG/ML EP SOLN
EPIDURAL | Status: DC | PRN
  Administered 2018-04-21: 70 ug

## 2018-04-21 MED ORDER — PROPOFOL 10 MG/ML IV BOLUS
INTRAVENOUS | Status: AC
  Filled 2018-04-21: qty 20

## 2018-04-21 MED ORDER — SODIUM CHLORIDE (PF) 0.9 % IJ SOLN
INTRAMUSCULAR | Status: AC
  Filled 2018-04-21: qty 50

## 2018-04-21 MED ORDER — CHLORHEXIDINE GLUCONATE 4 % EX LIQD: 60.0000 mL | Freq: Once | CUTANEOUS | Status: DC

## 2018-04-21 MED ORDER — ROPIVACAINE HCL 7.5 MG/ML IJ SOLN
INTRAMUSCULAR | Status: DC | PRN
  Administered 2018-04-21: 20 mL via PERINEURAL

## 2018-04-21 MED ORDER — 0.9 % SODIUM CHLORIDE (POUR BTL) OPTIME
TOPICAL | Status: DC | PRN
  Administered 2018-04-21: 1000 mL

## 2018-04-21 MED ORDER — LACTATED RINGERS IV SOLN
INTRAVENOUS | Status: DC
  Administered 2018-04-21 (×2): via INTRAVENOUS

## 2018-04-21 MED ORDER — PHENYLEPHRINE 40 MCG/ML (10ML) SYRINGE FOR IV PUSH (FOR BLOOD PRESSURE SUPPORT)
PREFILLED_SYRINGE | INTRAVENOUS | Status: DC | PRN
  Administered 2018-04-21: 100 ug via INTRAVENOUS
  Administered 2018-04-21: 80 ug via INTRAVENOUS
  Administered 2018-04-21: 100 ug via INTRAVENOUS

## 2018-04-21 MED ORDER — GABAPENTIN 300 MG PO CAPS
300.0000 mg | ORAL_CAPSULE | Freq: Once | ORAL | Status: AC
  Administered 2018-04-21: 300 mg via ORAL

## 2018-04-21 MED ORDER — BUPIVACAINE LIPOSOME 1.3 % IJ SUSP
INTRAMUSCULAR | Status: DC | PRN
  Administered 2018-04-21: 20 mL

## 2018-04-21 MED ORDER — DEXAMETHASONE SODIUM PHOSPHATE 10 MG/ML IJ SOLN
8.0000 mg | Freq: Once | INTRAMUSCULAR | Status: AC
  Administered 2018-04-21: 8 mg via INTRAVENOUS

## 2018-04-21 MED ORDER — FLEET ENEMA 7-19 GM/118ML RE ENEM: 1.0000 | ENEMA | Freq: Once | RECTAL | Status: DC | PRN

## 2018-04-21 MED ORDER — ACETAMINOPHEN 10 MG/ML IV SOLN
INTRAVENOUS | Status: AC
  Filled 2018-04-21: qty 100

## 2018-04-21 MED ORDER — PROPOFOL 10 MG/ML IV BOLUS
INTRAVENOUS | Status: DC | PRN
  Administered 2018-04-21: 40 mg via INTRAVENOUS

## 2018-04-21 MED ORDER — CEFAZOLIN SODIUM-DEXTROSE 2-4 GM/100ML-% IV SOLN
INTRAVENOUS | Status: AC
  Filled 2018-04-21: qty 100

## 2018-04-21 MED ORDER — MIDAZOLAM HCL 5 MG/5ML IJ SOLN
INTRAMUSCULAR | Status: DC | PRN
  Administered 2018-04-21: 2 mg via INTRAVENOUS

## 2018-04-21 MED ORDER — DEXAMETHASONE SODIUM PHOSPHATE 10 MG/ML IJ SOLN
10.0000 mg | Freq: Once | INTRAMUSCULAR | Status: AC
  Administered 2018-04-22: 10 mg via INTRAVENOUS
  Filled 2018-04-21: qty 1

## 2018-04-21 MED ORDER — PROMETHAZINE HCL 25 MG/ML IJ SOLN: 6.2500 mg | INTRAMUSCULAR | Status: DC | PRN

## 2018-04-21 MED ORDER — TRANEXAMIC ACID-NACL 1000-0.7 MG/100ML-% IV SOLN
INTRAVENOUS | Status: AC
  Filled 2018-04-21: qty 100

## 2018-04-21 MED ORDER — ACETAMINOPHEN 10 MG/ML IV SOLN
1000.0000 mg | Freq: Four times a day (QID) | INTRAVENOUS | Status: DC
  Administered 2018-04-21: 1000 mg via INTRAVENOUS

## 2018-04-21 MED ORDER — CEFAZOLIN SODIUM-DEXTROSE 2-4 GM/100ML-% IV SOLN
2.0000 g | INTRAVENOUS | Status: AC
  Administered 2018-04-21: 2 g via INTRAVENOUS

## 2018-04-21 MED ORDER — METOCLOPRAMIDE HCL 5 MG PO TABS: 5.0000 mg | ORAL_TABLET | Freq: Three times a day (TID) | ORAL | Status: DC | PRN

## 2018-04-21 MED ORDER — ONDANSETRON HCL 4 MG PO TABS: 4.0000 mg | ORAL_TABLET | Freq: Four times a day (QID) | ORAL | Status: DC | PRN

## 2018-04-21 MED ORDER — TRANEXAMIC ACID-NACL 1000-0.7 MG/100ML-% IV SOLN
1000.0000 mg | Freq: Once | INTRAVENOUS | Status: AC
  Administered 2018-04-21: 1000 mg via INTRAVENOUS
  Filled 2018-04-21: qty 100

## 2018-04-21 MED ORDER — BISACODYL 10 MG RE SUPP: 10.0000 mg | Freq: Every day | RECTAL | Status: DC | PRN

## 2018-04-21 MED ORDER — DIPHENHYDRAMINE HCL 12.5 MG/5ML PO ELIX: 12.5000 mg | ORAL_SOLUTION | ORAL | Status: DC | PRN

## 2018-04-21 MED ORDER — MENTHOL 3 MG MT LOZG: 1.0000 | LOZENGE | OROMUCOSAL | Status: DC | PRN

## 2018-04-21 MED ORDER — OXYCODONE HCL 5 MG PO TABS
5.0000 mg | ORAL_TABLET | ORAL | Status: DC | PRN
  Administered 2018-04-21 (×3): 5 mg via ORAL
  Administered 2018-04-22: 10 mg via ORAL
  Filled 2018-04-21: qty 1
  Filled 2018-04-21: qty 2
  Filled 2018-04-21 (×2): qty 1

## 2018-04-21 MED ORDER — PROPOFOL 10 MG/ML IV BOLUS
INTRAVENOUS | Status: AC
  Filled 2018-04-21: qty 60

## 2018-04-21 MED ORDER — METHOCARBAMOL 500 MG IVPB - SIMPLE MED
500.0000 mg | Freq: Four times a day (QID) | INTRAVENOUS | Status: DC | PRN
  Filled 2018-04-21: qty 50

## 2018-04-21 MED ORDER — GABAPENTIN 300 MG PO CAPS
300.0000 mg | ORAL_CAPSULE | Freq: Three times a day (TID) | ORAL | Status: DC
  Administered 2018-04-21 – 2018-04-22 (×3): 300 mg via ORAL
  Filled 2018-04-21 (×3): qty 1

## 2018-04-21 MED ORDER — ONDANSETRON HCL 4 MG/2ML IJ SOLN
INTRAMUSCULAR | Status: DC | PRN
  Administered 2018-04-21: 4 mg via INTRAVENOUS

## 2018-04-21 SURGICAL SUPPLY — 61 items
ATTUNE PSFEM LTSZ5 NARCEM KNEE (Femur) ×2 IMPLANT
ATTUNE PSRP INSR SZ5 6 KNEE (Insert) ×1 IMPLANT
ATTUNE PSRP INSR SZ5 6MM KNEE (Insert) ×1 IMPLANT
BAG ZIPLOCK 12X15 (MISCELLANEOUS) ×3 IMPLANT
BANDAGE ACE 4X5 VEL STRL LF (GAUZE/BANDAGES/DRESSINGS) ×2 IMPLANT
BANDAGE ACE 6X5 VEL STRL LF (GAUZE/BANDAGES/DRESSINGS) ×1 IMPLANT
BASEPLATE TIBIAL ROTATING SZ 4 (Knees) ×2 IMPLANT
BLADE SAG 18X100X1.27 (BLADE) ×3 IMPLANT
BLADE SAW SGTL 11.0X1.19X90.0M (BLADE) ×3 IMPLANT
BLADE SURG SZ10 CARB STEEL (BLADE) ×4 IMPLANT
BOWL SMART MIX CTS (DISPOSABLE) ×3 IMPLANT
CEMENT HV SMART SET (Cement) ×6 IMPLANT
CLOSURE WOUND 1/2 X4 (GAUZE/BANDAGES/DRESSINGS) ×2
COVER SURGICAL LIGHT HANDLE (MISCELLANEOUS) ×3 IMPLANT
COVER WAND RF STERILE (DRAPES) ×2 IMPLANT
CUFF TOURN SGL QUICK 34 (TOURNIQUET CUFF) ×2
CUFF TRNQT CYL 34X4X40X1 (TOURNIQUET CUFF) ×1 IMPLANT
DECANTER SPIKE VIAL GLASS SM (MISCELLANEOUS) ×3 IMPLANT
DRAPE U-SHAPE 47X51 STRL (DRAPES) ×3 IMPLANT
DRSG ADAPTIC 3X8 NADH LF (GAUZE/BANDAGES/DRESSINGS) ×3 IMPLANT
DRSG PAD ABDOMINAL 8X10 ST (GAUZE/BANDAGES/DRESSINGS) ×1 IMPLANT
DURAPREP 26ML APPLICATOR (WOUND CARE) ×3 IMPLANT
ELECT REM PT RETURN 15FT ADLT (MISCELLANEOUS) ×3 IMPLANT
EVACUATOR 1/8 PVC DRAIN (DRAIN) ×3 IMPLANT
GAUZE SPONGE 4X4 12PLY STRL (GAUZE/BANDAGES/DRESSINGS) ×3 IMPLANT
GLOVE BIO SURGEON STRL SZ7 (GLOVE) ×1 IMPLANT
GLOVE BIO SURGEON STRL SZ8 (GLOVE) ×3 IMPLANT
GLOVE BIOGEL PI IND STRL 6.5 (GLOVE) ×1 IMPLANT
GLOVE BIOGEL PI IND STRL 7.0 (GLOVE) ×1 IMPLANT
GLOVE BIOGEL PI IND STRL 8 (GLOVE) ×1 IMPLANT
GLOVE BIOGEL PI INDICATOR 6.5 (GLOVE) ×2
GLOVE BIOGEL PI INDICATOR 7.0 (GLOVE)
GLOVE BIOGEL PI INDICATOR 8 (GLOVE) ×2
GLOVE SURG SS PI 6.5 STRL IVOR (GLOVE) ×3 IMPLANT
GOWN STRL REUS W/TWL LRG LVL3 (GOWN DISPOSABLE) ×6 IMPLANT
GOWN STRL REUS W/TWL XL LVL3 (GOWN DISPOSABLE) ×3 IMPLANT
HANDPIECE INTERPULSE COAX TIP (DISPOSABLE) ×2
HOLDER FOLEY CATH W/STRAP (MISCELLANEOUS) ×2 IMPLANT
IMMOBILIZER KNEE 20 (SOFTGOODS) ×2 IMPLANT
IMMOBILIZER KNEE 20 THIGH 36 (SOFTGOODS) ×1 IMPLANT
MANIFOLD NEPTUNE II (INSTRUMENTS) ×3 IMPLANT
NS IRRIG 1000ML POUR BTL (IV SOLUTION) ×1 IMPLANT
PACK TOTAL KNEE CUSTOM (KITS) ×3 IMPLANT
PAD ABD 8X10 STRL (GAUZE/BANDAGES/DRESSINGS) ×2 IMPLANT
PADDING CAST COTTON 6X4 STRL (CAST SUPPLIES) ×7 IMPLANT
PATELLA MEDIAL ATTUN 35MM KNEE (Knees) ×2 IMPLANT
PIN STEINMAN FIXATION KNEE (PIN) ×2 IMPLANT
PIN THREADED HEADED SIGMA (PIN) ×2 IMPLANT
PROTECTOR NERVE ULNAR (MISCELLANEOUS) ×3 IMPLANT
SET HNDPC FAN SPRY TIP SCT (DISPOSABLE) ×1 IMPLANT
STRIP CLOSURE SKIN 1/2X4 (GAUZE/BANDAGES/DRESSINGS) ×4 IMPLANT
SUT MNCRL AB 4-0 PS2 18 (SUTURE) ×3 IMPLANT
SUT STRATAFIX 0 PDS 27 VIOLET (SUTURE) ×3
SUT VIC AB 2-0 CT1 27 (SUTURE) ×6
SUT VIC AB 2-0 CT1 TAPERPNT 27 (SUTURE) ×3 IMPLANT
SUTURE STRATFX 0 PDS 27 VIOLET (SUTURE) ×1 IMPLANT
TRAY FOLEY CATH 14FR (SET/KITS/TRAYS/PACK) ×2 IMPLANT
TRAY FOLEY MTR SLVR 16FR STAT (SET/KITS/TRAYS/PACK) ×1 IMPLANT
WATER STERILE IRR 1000ML POUR (IV SOLUTION) ×6 IMPLANT
WRAP KNEE MAXI GEL POST OP (GAUZE/BANDAGES/DRESSINGS) ×3 IMPLANT
YANKAUER SUCT BULB TIP 10FT TU (MISCELLANEOUS) ×3 IMPLANT

## 2018-04-21 NOTE — Anesthesia Procedure Notes (Signed)
Anesthesia Regional Block: Adductor canal block   Pre-Anesthetic Checklist: ,, timeout performed, Correct Patient, Correct Site, Correct Laterality, Correct Procedure, Correct Position, site marked, Risks and benefits discussed,  Surgical consent,  Pre-op evaluation,  At surgeon's request and post-op pain management  Laterality: Left  Prep: chloraprep       Needles:  Injection technique: Single-shot  Needle Type: Echogenic Needle     Needle Length: 9cm      Additional Needles:   Procedures:,,,, ultrasound used (permanent image in chart),,,,  Narrative:  Start time: 04/21/2018 6:45 AM End time: 04/21/2018 6:56 AM Injection made incrementally with aspirations every 5 mL.  Performed by: Personally  Anesthesiologist: Eilene Ghazi, MD  Additional Notes: Patient tolerated the procedure well without complications

## 2018-04-21 NOTE — Anesthesia Preprocedure Evaluation (Signed)
Anesthesia Evaluation  Patient identified by MRN, date of birth, ID band Patient awake    Reviewed: Allergy & Precautions, NPO status , Patient's Chart, lab work & pertinent test results  Airway Mallampati: II  TM Distance: >3 FB Neck ROM: Full    Dental no notable dental hx.    Pulmonary neg pulmonary ROS,    Pulmonary exam normal breath sounds clear to auscultation       Cardiovascular negative cardio ROS Normal cardiovascular exam Rhythm:Regular Rate:Normal     Neuro/Psych negative neurological ROS  negative psych ROS   GI/Hepatic negative GI ROS, Neg liver ROS,   Endo/Other  Hypothyroidism   Renal/GU negative Renal ROS  negative genitourinary   Musculoskeletal negative musculoskeletal ROS (+)   Abdominal   Peds negative pediatric ROS (+)  Hematology negative hematology ROS (+)   Anesthesia Other Findings   Reproductive/Obstetrics negative OB ROS                             Anesthesia Physical Anesthesia Plan  ASA: II  Anesthesia Plan: Spinal   Post-op Pain Management:  Regional for Post-op pain   Induction: Intravenous  PONV Risk Score and Plan: 2 and Ondansetron and Dexamethasone  Airway Management Planned: Simple Face Mask  Additional Equipment:   Intra-op Plan:   Post-operative Plan:   Informed Consent: I have reviewed the patients History and Physical, chart, labs and discussed the procedure including the risks, benefits and alternatives for the proposed anesthesia with the patient or authorized representative who has indicated his/her understanding and acceptance.   Dental advisory given  Plan Discussed with: CRNA and Surgeon  Anesthesia Plan Comments:         Anesthesia Quick Evaluation

## 2018-04-21 NOTE — Anesthesia Postprocedure Evaluation (Signed)
Anesthesia Post Note  Patient: Savannah Summers  Procedure(s) Performed: TOTAL KNEE ARTHROPLASTY (Left )     Patient location during evaluation: PACU Anesthesia Type: Spinal Level of consciousness: oriented and awake and alert Pain management: pain level controlled Vital Signs Assessment: post-procedure vital signs reviewed and stable Respiratory status: spontaneous breathing, respiratory function stable and patient connected to nasal cannula oxygen Cardiovascular status: blood pressure returned to baseline and stable Postop Assessment: no headache, no backache and no apparent nausea or vomiting Anesthetic complications: no    Last Vitals:  Vitals:   04/21/18 0930 04/21/18 0945  BP: 109/75 110/72  Pulse: 63 (!) 56  Resp: 13 12  Temp: (!) 36.3 C   SpO2: 98% 98%    Last Pain:  Vitals:   04/21/18 0930  TempSrc:   PainSc: 0-No pain                 Nizar Cutler S

## 2018-04-21 NOTE — Evaluation (Signed)
Physical Therapy Evaluation Patient Details Name: Savannah Summers MRN: [id number] DOB: 10/29/1965 Today's Date: 04/21/2018   History of Present Illness  s/p L TKA   Clinical Impression  Pt is s/p TKA resulting in the deficits listed below (see PT Problem List).  PT amb 50' with RW and min/guard; should continue to progress well, pt is very motivated and wants to go home tomorrow * Pt will benefit from skilled PT to increase their independence and safety with mobility to allow discharge to the venue listed below.      Follow Up Recommendations Follow surgeon's recommendation for DC plan and follow-up therapies    Equipment Recommendations  Other (comment)(may need RW, has SW and rollator)    Recommendations for Other Services       Precautions / Restrictions Precautions Precautions: Knee Precaution Comments: able to perform SLRs Independently today Required Braces or Orthoses: Knee Immobilizer - Left Knee Immobilizer - Left: Discontinue once straight leg raise with < 10 degree lag Restrictions Weight Bearing Restrictions: No Other Position/Activity Restrictions: WBAT      Mobility  Bed Mobility Overal bed mobility: Needs Assistance Bed Mobility: Supine to Sit     Supine to sit: Supervision     General bed mobility comments: for safety and lines  Transfers Overall transfer level: Needs assistance Equipment used: Rolling walker (2 wheeled) Transfers: Sit to/from Stand Sit to Stand: Min guard         General transfer comment: cues for hand placement  Ambulation/Gait Ambulation/Gait assistance: Min assist Gait Distance (Feet): 80 Feet Assistive device: Rolling walker (2 wheeled) Gait Pattern/deviations: Step-to pattern;Decreased weight shift to left     General Gait Details: cues for sequence  Stairs            Wheelchair Mobility    Modified Rankin (Stroke Patients Only)       Balance                                              Pertinent Vitals/Pain Pain Assessment: 0-10 Pain Score: 3  Pain Location: left knee Pain Descriptors / Indicators: Discomfort;Guarding Pain Intervention(s): Limited activity within patient's tolerance;Monitored during session;Premedicated before session;Ice applied    Home Living Family/patient expects to be discharged to:: Private residence Living Arrangements: Spouse/significant other Available Help at Discharge: Family Type of Home: House Home Access: Stairs to enter   [employer] of Steps: brick size step and one step to get in Home Layout: Two level;Able to live on main level with bedroom/bathroom;1/2 bath on main level Home Equipment: Walker - 4 wheels;Walker - standard      Prior Function Level of Independence: Independent               Hand Dominance        Extremity/Trunk Assessment   Upper Extremity Assessment Upper Extremity Assessment: Overall WFL for tasks assessed    Lower Extremity Assessment Lower Extremity Assessment: LLE deficits/detail LLE Deficits / Details: ankle WFL; knee and hip grossly 2+/5, Knee AROM grossly 8* to 55* flexion       Communication   Communication: No difficulties  Cognition Arousal/Alertness: Awake/alert Behavior During Therapy: WFL for tasks assessed/performed Overall Cognitive Status: Within Functional Limits for tasks assessed  General Comments      Exercises Total Joint Exercises Ankle Circles/Pumps: AROM;Both;10 reps Quad Sets: AROM;Both;5 reps   Assessment/Plan    PT Assessment Patient needs continued PT services  PT Problem List Decreased strength;Decreased balance;Decreased range of motion;Decreased activity tolerance;Decreased knowledge of use of DME       PT Treatment Interventions Stair training;Gait training;DME instruction;Functional mobility training;Therapeutic activities;Therapeutic exercise;Patient/family education    PT  Goals (Current goals can be found in the Care Plan section)  Acute Rehab PT Goals Patient Stated Goal: less pain, return to active lifestyle PT Goal Formulation: With patient Time For Goal Achievement: 04/28/18 Potential to Achieve Goals: Good    Frequency 7X/week   Barriers to discharge        Co-evaluation               AM-PAC PT "6 Clicks" Mobility  Outcome Measure Help needed turning from your back to your side while in a flat bed without using bedrails?: A Little Help needed moving from lying on your back to sitting on the side of a flat bed without using bedrails?: A Little Help needed moving to and from a bed to a chair (including a wheelchair)?: A Little Help needed standing up from a chair using your arms (e.g., wheelchair or bedside chair)?: A Little Help needed to walk in hospital room?: A Little Help needed climbing 3-5 steps with a railing? : A Little 6 Click Score: 18    End of Session Equipment Utilized During Treatment: Gait belt Activity Tolerance: Patient tolerated treatment well Patient left: in chair;with chair alarm set;with call bell/phone within reach   PT Visit Diagnosis: Difficulty in walking, not elsewhere classified (R26.2)    Time: 1350-1404 PT Time Calculation (min) (ACUTE ONLY): 14 min   Charges:   PT Evaluation $PT Eval Low Complexity: 1 Low          Drucilla Chalet, PT  Pager: [phone] Acute Rehab Dept [facility]): [phone]   04/21/2018   [facility] 04/21/2018, 3:11 PM

## 2018-04-21 NOTE — Interval H&P Note (Signed)
History and Physical Interval Note:  04/21/2018 6:28 AM  Savannah Summers  has presented today for surgery, with the diagnosis of left knee osteoarthritis  The various methods of treatment have been discussed with the patient and family. After consideration of risks, benefits and other options for treatment, the patient has consented to  Procedure(s) with comments: TOTAL KNEE ARTHROPLASTY (Left) - as a surgical intervention .  The patient's history has been reviewed, patient examined, no change in status, stable for surgery.  I have reviewed the patient's chart and labs.  Questions were answered to the patient's satisfaction.     Homero Fellers Nakima Fluegge

## 2018-04-21 NOTE — Anesthesia Procedure Notes (Signed)
Anesthesia Procedure Image    

## 2018-04-21 NOTE — Anesthesia Procedure Notes (Signed)
Spinal  Patient location during procedure: OR Start time: 04/21/2018 7:14 AM End time: 04/21/2018 7:17 AM Reason for block: at surgeon's request Staffing Resident/CRNA: Anne Fu, CRNA Performed: resident/CRNA  Preanesthetic Checklist Completed: patient identified, site marked, surgical consent, pre-op evaluation, timeout performed, IV checked, risks and benefits discussed and monitors and equipment checked Spinal Block Patient position: sitting Prep: DuraPrep Patient monitoring: heart rate, continuous pulse ox and blood pressure Approach: right paramedian Location: L2-3 Injection technique: single-shot Needle Needle type: Pencan  Needle gauge: 24 G Needle length: 9 cm Assessment Sensory level: T6 Additional Notes  Functioning IV was confirmed and monitors were applied. Expiration date of kit checked and confirmed. Sterile prep and drape, including hand hygiene and sterile gloves were used. The patient was positioned and the spine was prepped. The skin was anesthetized with lidocaine.  Free flow of clear CSF was obtained prior to injecting local anesthetic into the CSF X 1 attempt.  The spinal needle aspirated freely following injection.  The needle was carefully withdrawn. Patient tolerated procedure well, without complications. Loss of motor and sensory on exam post injection.

## 2018-04-21 NOTE — Transfer of Care (Signed)
Immediate Anesthesia Transfer of Care Note  Patient: Savannah Summers  Procedure(s) Performed: Procedure(s) with comments: TOTAL KNEE ARTHROPLASTY (Left) -  Patient Location: PACU  Anesthesia Type:Spinal  Level of Consciousness:  sedated, patient cooperative and responds to stimulation  Airway & Oxygen Therapy:Patient Spontanous Breathing and Patient connected to face mask oxgen  Post-op Assessment:  Report given to PACU RN and Post -op Vital signs reviewed and stable  Post vital signs:  Reviewed and stable  Last Vitals:  Vitals:   04/21/18 0543 04/21/18 0832  BP: 131/83   Pulse: 69 (P) 80  Resp: 16 (P) 15  Temp: 36.9 C   SpO2: 97% (P) 99%    Complications: No apparent anesthesia complications

## 2018-04-21 NOTE — Plan of Care (Signed)
Plan of care 

## 2018-04-21 NOTE — Discharge Instructions (Signed)
° °Dr. Frank Aluisio °Total Joint Specialist °[facility] °[address] °[place], [place] [address] °[phone] ° °TOTAL KNEE REPLACEMENT POSTOPERATIVE DIRECTIONS ° °Knee Rehabilitation, Guidelines Following Surgery  °Results after knee surgery are often greatly improved when you follow the exercise, range of motion and muscle strengthening exercises prescribed by your doctor. Safety measures are also important to protect the knee from further injury. Any time any of these exercises cause you to have increased pain or swelling in your knee joint, decrease the amount until you are comfortable again and slowly increase them. If you have problems or questions, call your caregiver or physical therapist for advice.  ° °HOME CARE INSTRUCTIONS  °• Remove items at home which could result in a fall. This includes throw rugs or furniture in walking pathways.  °· ICE to the affected knee every three hours for 30 minutes at a time and then as needed for pain and swelling.  Continue to use ice on the knee for pain and swelling from surgery. You may notice swelling that will progress down to the foot and ankle.  This is normal after surgery.  Elevate the leg when you are not up walking on it.   °· Continue to use the breathing machine which will help keep your temperature down.  It is common for your temperature to cycle up and down following surgery, especially at night when you are not up moving around and exerting yourself.  The breathing machine keeps your lungs expanded and your temperature down. °· Do not place pillow under knee, focus on keeping the knee straight while resting ° °DIET °You may resume your previous home diet once your are discharged from the hospital. ° °DRESSING / WOUND CARE / SHOWERING °You may shower 3 days after surgery, but keep the wounds dry during showering.  You may use an occlusive plastic wrap (Press'n Seal for example), NO SOAKING/SUBMERGING IN THE BATHTUB.  If the bandage  gets wet, change with a clean dry gauze.  If the incision gets wet, pat the wound dry with a clean towel. °You may start showering once you are discharged home but do not submerge the incision under water. Just pat the incision dry and apply a dry gauze dressing on daily. °Change the surgical dressing daily and reapply a dry dressing each time. ° °ACTIVITY °Walk with your walker as instructed. °Use walker as long as suggested by your caregivers. °Avoid periods of inactivity such as sitting longer than an hour when not asleep. This helps prevent blood clots.  °You may resume a sexual relationship in one month or when given the OK by your doctor.  °You may return to work once you are cleared by your doctor.  °Do not drive a car for 6 weeks or until released by you surgeon.  °Do not drive while taking narcotics. ° °WEIGHT BEARING °Weight bearing as tolerated with assist device (walker, cane, etc) as directed, use it as long as suggested by your surgeon or therapist, typically at least 4-6 weeks. ° °POSTOPERATIVE CONSTIPATION PROTOCOL °Constipation - defined medically as fewer than three stools per week and severe constipation as less than one stool per week. ° °One of the most common issues patients have following surgery is constipation.  Even if you have a regular bowel pattern at home, your normal regimen is likely to be disrupted due to multiple reasons following surgery.  Combination of anesthesia, postoperative narcotics, change in appetite and fluid intake all can affect your bowels.    In order to avoid complications following surgery, here are some recommendations in order to help you during your recovery period. ° °Colace (docusate) - Pick up an over-the-counter form of Colace or another stool softener and take twice a day as long as you are requiring postoperative pain medications.  Take with a full glass of water daily.  If you experience loose stools or diarrhea, hold the colace until you stool forms back  up.  If your symptoms do not get better within 1 week or if they get worse, check with your doctor. ° °Dulcolax (bisacodyl) - Pick up over-the-counter and take as directed by the product packaging as needed to assist with the movement of your bowels.  Take with a full glass of water.  Use this product as needed if not relieved by Colace only.  ° °MiraLax (polyethylene glycol) - Pick up over-the-counter to have on hand.  MiraLax is a solution that will increase the amount of water in your bowels to assist with bowel movements.  Take as directed and can mix with a glass of water, juice, soda, coffee, or tea.  Take if you go more than two days without a movement. °Do not use MiraLax more than once per day. Call your doctor if you are still constipated or irregular after using this medication for 7 days in a row. ° °If you continue to have problems with postoperative constipation, please contact the office for further assistance and recommendations.  If you experience "the worst abdominal pain ever" or develop nausea or vomiting, please contact the office immediatly for further recommendations for treatment. ° °ITCHING ° If you experience itching with your medications, try taking only a single pain pill, or even half a pain pill at a time.  You can also use Benadryl over the counter for itching or also to help with sleep.  ° °TED HOSE STOCKINGS °Wear the elastic stockings on both legs for three weeks following surgery during the day but you may remove then at night for sleeping. ° °MEDICATIONS °See your medication summary on the “After Visit Summary” that the nursing staff will review with you prior to discharge.  You may have some home medications which will be placed on hold until you complete the course of blood thinner medication.  It is important for you to complete the blood thinner medication as prescribed by your surgeon.  Continue your approved medications as instructed at time of discharge. ° °PRECAUTIONS °If  you experience chest pain or shortness of breath - call 911 immediately for transfer to the hospital emergency department.  °If you develop a fever greater that 101 F, purulent drainage from wound, increased redness or drainage from wound, foul odor from the wound/dressing, or calf pain - CONTACT YOUR SURGEON.   °                                                °FOLLOW-UP APPOINTMENTS °Make sure you keep all of your appointments after your operation with your surgeon and caregivers. You should call the office at the above phone number and make an appointment for approximately two weeks after the date of your surgery or on the date instructed by your surgeon outlined in the "After Visit Summary". ° ° °RANGE OF MOTION AND STRENGTHENING EXERCISES  °Rehabilitation of the knee is important following a knee injury or   an operation. After just a few days of immobilization, the muscles of the thigh which control the knee become weakened and shrink (atrophy). Knee exercises are designed to build up the tone and strength of the thigh muscles and to improve knee motion. Often times heat used for twenty to thirty minutes before working out will loosen up your tissues and help with improving the range of motion but do not use heat for the first two weeks following surgery. These exercises can be done on a training (exercise) mat, on the floor, on a table or on a bed. Use what ever works the best and is most comfortable for you Knee exercises include:  °• Leg Lifts - While your knee is still immobilized in a splint or cast, you can do straight leg raises. Lift the leg to 60 degrees, hold for 3 sec, and slowly lower the leg. Repeat 10-20 times 2-3 times daily. Perform this exercise against resistance later as your knee gets better.  °• Quad and Hamstring Sets - Tighten up the muscle on the front of the thigh (Quad) and hold for 5-10 sec. Repeat this 10-20 times hourly. Hamstring sets are done by pushing the foot backward against an  object and holding for 5-10 sec. Repeat as with quad sets.  °· Leg Slides: Lying on your back, slowly slide your foot toward your buttocks, bending your knee up off the floor (only go as far as is comfortable). Then slowly slide your foot back down until your leg is flat on the floor again. °· Angel Wings: Lying on your back spread your legs to the side as far apart as you can without causing discomfort.  °A rehabilitation program following serious knee injuries can speed recovery and prevent re-injury in the future due to weakened muscles. Contact your doctor or a physical therapist for more information on knee rehabilitation.  ° °IF YOU ARE TRANSFERRED TO A SKILLED REHAB FACILITY °If the patient is transferred to a skilled rehab facility following release from the hospital, a list of the current medications will be sent to the facility for the patient to continue.  When discharged from the skilled rehab facility, please have the facility set up the patient's Home Health Physical Therapy prior to being released. Also, the skilled facility will be responsible for providing the patient with their medications at time of release from the facility to include their pain medication, the muscle relaxants, and their blood thinner medication. If the patient is still at the rehab facility at time of the two week follow up appointment, the skilled rehab facility will also need to assist the patient in arranging follow up appointment in our office and any transportation needs. ° °MAKE SURE YOU:  °• Understand these instructions.  °• Get help right away if you are not doing well or get worse.  ° ° °Pick up stool softner and laxative for home use following surgery while on pain medications. °Do not submerge incision under water. °Please use good hand washing techniques while changing dressing each day. °May shower starting three days after surgery. °Please use a clean towel to pat the incision dry following showers. °Continue to  use ice for pain and swelling after surgery. °Do not use any lotions or creams on the incision until instructed by your surgeon. ° °

## 2018-04-21 NOTE — Op Note (Signed)
OPERATIVE REPORT-TOTAL KNEE ARTHROPLASTY   Pre-operative diagnosis- Osteoarthritis  Left knee(s)  Post-operative diagnosis- Osteoarthritis Left knee(s)  Procedure-  Left  Total Knee Arthroplasty  Surgeon- Savannah Rankin. Terrianne Cavness, MD  Assistant- Dimitri Ped, PA-C   Anesthesia-  Adductor canal block and spinal  EBL-100 mL   Drains Hemovac  Tourniquet time-  Total Tourniquet Time Documented: Thigh (Left) - 33 minutes Total: Thigh (Left) - 33 minutes     Complications- None  Condition-PACU - hemodynamically stable.   Brief Clinical Note  Savannah Summers is a 53 y.o. year old female with end stage OA of her left knee with progressively worsening pain and dysfunction. She has constant pain, with activity and at rest and significant functional deficits with difficulties even with ADLs. She has had extensive non-op management including analgesics, injections of cortisone and viscosupplements, and home exercise program, but remains in significant pain with significant dysfunction. Radiographs show bone on bone arthritis medial and patellofemoral. She presents now for left Total Knee Arthroplasty.    Procedure in detail---   The patient is brought into the operating room and positioned supine on the operating table. After successful administration of  Adductor canal block and spinal,   a tourniquet is placed high on the  Left thigh(s) and the lower extremity is prepped and draped in the usual sterile fashion. Time out is performed by the operating team and then the  Left lower extremity is wrapped in Esmarch, knee flexed and the tourniquet inflated to 300 mmHg.       A midline incision is made with a ten blade through the subcutaneous tissue to the level of the extensor mechanism. A fresh blade is used to make a medial parapatellar arthrotomy. Soft tissue over the proximal medial tibia is subperiosteally elevated to the joint line with a knife and into the semimembranosus bursa with a Cobb  elevator. Soft tissue over the proximal lateral tibia is elevated with attention being paid to avoiding the patellar tendon on the tibial tubercle. The patella is everted, knee flexed 90 degrees and the ACL and PCL are removed. Findings are bone on bone medial and patellofemoral with large global osteophytes.        The drill is used to create a starting hole in the distal femur and the canal is thoroughly irrigated with sterile saline to remove the fatty contents. The 5 degree Left  valgus alignment guide is placed into the femoral canal and the distal femoral cutting block is pinned to remove 9 mm off the distal femur. Resection is made with an oscillating saw.      The tibia is subluxed forward and the menisci are removed. The extramedullary alignment guide is placed referencing proximally at the medial aspect of the tibial tubercle and distally along the second metatarsal axis and tibial crest. The block is pinned to remove 26mm off the more deficient medial  side. Resection is made with an oscillating saw. Size 4is the most appropriate size for the tibia and the proximal tibia is prepared with the modular drill and keel punch for that size.      The femoral sizing guide is placed and size 5 is most appropriate. Rotation is marked off the epicondylar axis and confirmed by creating a rectangular flexion gap at 90 degrees. The size 5 cutting block is pinned in this rotation and the anterior, posterior and chamfer cuts are made with the oscillating saw. The intercondylar block is then placed and that cut is made.  Trial size 4 tibial component, trial size 5 narrow posterior stabilized femur and a 6  mm posterior stabilized rotating platform insert trial is placed. Full extension is achieved with excellent varus/valgus and anterior/posterior balance throughout full range of motion. The patella is everted and thickness measured to be 21  mm. Free hand resection is taken to 12 mm, a 35 template is placed, lug  holes are drilled, trial patella is placed, and it tracks normally. Osteophytes are removed off the posterior femur with the trial in place. All trials are removed and the cut bone surfaces prepared with pulsatile lavage. Cement is mixed and once ready for implantation, the size 4 tibial implant, size  5 narrow posterior stabilized femoral component, and the size 35 patella are cemented in place and the patella is held with the clamp. The trial insert is placed and the knee held in full extension. The Exparel (20 ml mixed with 60 ml saline) is injected into the extensor mechanism, posterior capsule, medial and lateral gutters and subcutaneous tissues.  All extruded cement is removed and once the cement is hard the permanent 6 mm posterior stabilized rotating platform insert is placed into the tibial tray.      The wound is copiously irrigated with saline solution and the extensor mechanism closed over a hemovac drain with #1 V-loc suture. The tourniquet is released for a total tourniquet time of 33  minutes. Flexion against gravity is 140 degrees and the patella tracks normally. Subcutaneous tissue is closed with 2.0 vicryl and subcuticular with running 4.0 Monocryl. The incision is cleaned and dried and steri-strips and a bulky sterile dressing are applied. The limb is placed into a knee immobilizer and the patient is awakened and transported to recovery in stable condition.      Please note that a surgical assistant was a medical necessity for this procedure in order to perform it in a safe and expeditious manner. Surgical assistant was necessary to retract the ligaments and vital neurovascular structures to prevent injury to them and also necessary for proper positioning of the limb to allow for anatomic placement of the prosthesis.   Savannah Rankin Gerrica Cygan, MD    04/21/2018, 8:16 AM

## 2018-04-22 ENCOUNTER — Encounter (HOSPITAL_COMMUNITY): Payer: Self-pay | Admitting: Orthopedic Surgery

## 2018-04-22 LAB — CBC
HCT: 37.6 % (ref 36.0–46.0)
Hemoglobin: 12.2 g/dL (ref 12.0–15.0)
MCH: 28.8 pg (ref 26.0–34.0)
MCHC: 32.4 g/dL (ref 30.0–36.0)
MCV: 88.7 fL (ref 80.0–100.0)
Platelets: 234 10*3/uL (ref 150–400)
RBC: 4.24 MIL/uL (ref 3.87–5.11)
RDW: 12.6 % (ref 11.5–15.5)
WBC: 16.5 10*3/uL — ABNORMAL HIGH (ref 4.0–10.5)
nRBC: 0 % (ref 0.0–0.2)

## 2018-04-22 LAB — BASIC METABOLIC PANEL
Anion gap: 9 (ref 5–15)
BUN: 16 mg/dL (ref 6–20)
CO2: 25 mmol/L (ref 22–32)
Calcium: 8.9 mg/dL (ref 8.9–10.3)
Chloride: 105 mmol/L (ref 98–111)
Creatinine, Ser: 0.91 mg/dL (ref 0.44–1.00)
GFR calc Af Amer: >60 mL/min (ref >60)
GFR calc non Af Amer: >60 mL/min (ref >60)
Glucose, Bld: 137 mg/dL — ABNORMAL HIGH (ref 70–99)
Potassium: 4.4 mmol/L (ref 3.5–5.1)
Sodium: 139 mmol/L (ref 135–145)

## 2018-04-22 MED ORDER — ASPIRIN 325 MG PO TBEC: 325.0000 mg | DELAYED_RELEASE_TABLET | Freq: Two times a day (BID) | ORAL | 0 refills | Status: AC

## 2018-04-22 MED ORDER — OXYCODONE HCL 5 MG PO TABS: 5.0000 mg | ORAL_TABLET | Freq: Four times a day (QID) | ORAL | 0 refills | Status: DC | PRN

## 2018-04-22 MED ORDER — TRAMADOL HCL 50 MG PO TABS: 50.0000 mg | ORAL_TABLET | Freq: Four times a day (QID) | ORAL | 0 refills | Status: DC | PRN

## 2018-04-22 MED ORDER — METHOCARBAMOL 500 MG PO TABS: 500.0000 mg | ORAL_TABLET | Freq: Four times a day (QID) | ORAL | 0 refills | Status: DC | PRN

## 2018-04-22 MED ORDER — GABAPENTIN 300 MG PO CAPS: 300.0000 mg | ORAL_CAPSULE | Freq: Three times a day (TID) | ORAL | 0 refills | Status: DC

## 2018-04-22 NOTE — Care Management Note (Signed)
Case Management Note  Patient Details  Name: Savannah Summers MRN: [id number] Date of Birth: 08-12-1965  Subjective/Objective:    Spoke with patient at bedside. Confirmed plan for OP PT, already arranged. Has RW and 3n1. [phone]                Action/Plan:   Expected Discharge Date:  04/22/18               Expected Discharge Plan:  OP Rehab  In-House Referral:  NA  Discharge planning Services  CM Consult  Post Acute Care Choice:  NA Choice offered to:  Patient  DME Arranged:  N/A DME Agency:  NA  HH Arranged:  NA HH Agency:  NA  Status of Service:  Completed, signed off  If discussed at Long Length of Stay Meetings, dates discussed:    Additional Comments:  Alexis Goodell, RN 04/22/2018, 9:20 AM

## 2018-04-22 NOTE — Plan of Care (Signed)
Plan of care reviewed and discussed with the patient and her husband.  Questions answered to patient's satisfaction. 

## 2018-04-22 NOTE — Progress Notes (Signed)
   Subjective: 1 Day Post-Op Procedure(s) (LRB): TOTAL KNEE ARTHROPLASTY (Left) Patient reports pain as mild.   Patient seen in rounds by Dr. Lequita Halt. Patient is well, and has had no acute complaints or problems other than pain in the left knee. States she is ready to go home. Foley catheter removed this AM. Denies chest pain, SOB, or calf pain. No issues overnight. We will continue therapy today.   Objective: Vital signs in last 24 hours: Temp:  [97.3 F (36.3 C)-98.2 F (36.8 C)] 97.8 F (36.6 C) (01/07 0517) Pulse Rate:  [54-80] 63 (01/07 0517) Resp:  [12-18] 17 (01/07 0517) BP: (105-146)/(58-93) 121/81 (01/07 0517) SpO2:  [96 %-100 %] 98 % (01/07 0517) Weight:  [89 kg] 89 kg (01/06 1024)  Intake/Output from previous day:  Intake/Output Summary (Last 24 hours) at 04/22/2018 0656 Last data filed at 04/22/2018 0600 Gross per 24 hour  Intake 3089.5 ml  Output 6385 ml  Net -3295.5 ml     Intake/Output this shift: Total I/O In: 762.9 [P.O.:420; I.V.:242.9; IV Piggyback:100] Out: 3525 [Urine:3400; Drains:125]  Labs: Recent Labs    04/22/18 0437  HGB 12.2   Recent Labs    04/22/18 0437  WBC 16.5*  RBC 4.24  HCT 37.6  PLT 234   Recent Labs    04/22/18 0437  NA 139  K 4.4  CL 105  CO2 25  BUN 16  CREATININE 0.91  GLUCOSE 137*  CALCIUM 8.9   Exam: General - Patient is Alert and Oriented Extremity - Neurologically intact Neurovascular intact Sensation intact distally Dorsiflexion/Plantar flexion intact Dressing - dressing C/D/I Motor Function - intact, moving foot and toes well on exam.   Past Medical History:  Diagnosis Date  . Allergy   . Arthritis   . Chicken pox   . Hypothyroidism     Assessment/Plan: 1 Day Post-Op Procedure(s) (LRB): TOTAL KNEE ARTHROPLASTY (Left) Principal Problem:   OA (osteoarthritis) of knee  Estimated body mass index is 37.07 kg/m as calculated from the following:   Height as of this encounter: 5\' 1"  (1.549 m).  Weight as of this encounter: 89 kg. Advance diet Up with therapy D/C IV fluids  Anticipated LOS equal to or greater than 2 midnights due to - Age 20 and older with one or more of the following:  - Obesity  - Expected need for hospital services (PT, OT, Nursing) required for safe  discharge  - Anticipated need for postoperative skilled nursing care or inpatient rehab  - Active co-morbidities: None OR   - Unanticipated findings during/Post Surgery: None  - Patient is a high risk of re-admission due to: None    DVT Prophylaxis - Aspirin Weight bearing as tolerated. D/C O2 and pulse ox and try on room air. Hemovac pulled without difficulty, will continue therapy today.  Plan is to go Home after hospital stay.  Plan for d/c today after two sessions of therapy. Scheduled for outpatient physical therapy at [facility]. Follow-up in the office in 2 weeks with Dr. Lequita Halt.  Arther Abbott, PA-C Orthopedic Surgery 04/22/2018, 6:56 AM

## 2018-04-22 NOTE — Progress Notes (Signed)
Physical Therapy Treatment Patient Details Name: Savannah Summers MRN: [id number] DOB: 07-21-1965 Today's Date: 04/22/2018    History of Present Illness s/p L TKA     PT Comments    Progressing well, pain controlled, reviewed gait, stairs, HEP; Ready for d/c from PT standpoint;    Follow Up Recommendations  Follow surgeon's recommendation for DC plan and follow-up therapies     Equipment Recommendations       Recommendations for Other Services       Precautions / Restrictions Precautions Precautions: Knee Precaution Comments: able to perform SLRs Independently today Knee Immobilizer - Left: Discontinue once straight leg raise with < 10 degree lag Restrictions Weight Bearing Restrictions: No Other Position/Activity Restrictions: WBAT    Mobility  Bed Mobility               General bed mobility comments: in chair  Transfers Overall transfer level: Needs assistance Equipment used: Rolling walker (2 wheeled) Transfers: Sit to/from Stand Sit to Stand: Supervision;Modified independent (Device/Increase time)         General transfer comment: cues for hand placement  Ambulation/Gait Ambulation/Gait assistance: Supervision;Modified independent (Device/Increase time) Gait Distance (Feet): 200 Feet Assistive device: Rolling walker (2 wheeled) Gait Pattern/deviations: Step-to pattern;Decreased weight shift to left     General Gait Details: cues for sequence, gait progression   Stairs Stairs: Yes Stairs assistance: Min guard Stair Management: With walker;Step to pattern Number of Stairs: 1 General stair comments: cues for sequence   Wheelchair Mobility    Modified Rankin (Stroke Patients Only)       Balance                                            Cognition Arousal/Alertness: Awake/alert Behavior During Therapy: WFL for tasks assessed/performed Overall Cognitive Status: Within Functional Limits for tasks assessed                                        Exercises Total Joint Exercises Ankle Circles/Pumps: AROM;Both;10 reps Quad Sets: AROM;Both;10 reps Short Arc Quad: AROM;Left;10 reps Heel Slides: AAROM;Left;10 reps Hip ABduction/ADduction: AROM;Left;10 reps Straight Leg Raises: AROM;Left;10 reps Goniometric ROM: grossly 7* to 65* AAROM knee flexion    General Comments        Pertinent Vitals/Pain Pain Assessment: 0-10 Pain Score: 4  Pain Location: left knee Pain Descriptors / Indicators: Discomfort;Guarding Pain Intervention(s): Limited activity within patient's tolerance;Monitored during session;Premedicated before session;Ice applied    Home Living                      Prior Function            PT Goals (current goals can now be found in the care plan section) Acute Rehab PT Goals Patient Stated Goal: less pain, return to active lifestyle PT Goal Formulation: With patient Time For Goal Achievement: 04/28/18 Potential to Achieve Goals: Good Progress towards PT goals: Progressing toward goals    Frequency    7X/week      PT Plan Current plan remains appropriate    Co-evaluation              AM-PAC PT "6 Clicks" Mobility   Outcome Measure  Help needed turning from your back to your side while in a  flat bed without using bedrails?: A Little Help needed moving from lying on your back to sitting on the side of a flat bed without using bedrails?: None Help needed moving to and from a bed to a chair (including a wheelchair)?: None Help needed standing up from a chair using your arms (e.g., wheelchair or bedside chair)?: None Help needed to walk in hospital room?: A Little Help needed climbing 3-5 steps with a railing? : A Little 6 Click Score: 21    End of Session Equipment Utilized During Treatment: Gait belt Activity Tolerance: Patient tolerated treatment well Patient left: in chair;with call bell/phone within reach;with family/visitor present Nurse  Communication: Other (comment)(ready fo rd/c) PT Visit Diagnosis: Difficulty in walking, not elsewhere classified (R26.2)     Time: 9390-3009 PT Time Calculation (min) (ACUTE ONLY): 31 min  Charges:  $Gait Training: 8-22 mins $Therapeutic Exercise: 8-22 mins                     Drucilla Chalet, PT  Pager: [phone] Acute Rehab Dept [facility]): [phone]   04/22/2018    [facility] 04/22/2018, 9:53 AM

## 2018-04-23 NOTE — Discharge Summary (Signed)
Physician Discharge Summary   Patient ID: Savannah Summers MRN: [id number] DOB/AGE: 55/04/1965 55 y.o.  Admit date: 04/21/2018 Discharge date: 04/22/2018  Primary Diagnosis: Osteoarthritis, left knee   Admission Diagnoses:  Past Medical History:  Diagnosis Date  . Allergy   . Arthritis   . Chicken pox   . Hypothyroidism    Discharge Diagnoses:   Principal Problem:   OA (osteoarthritis) of knee  Estimated body mass index is 37.07 kg/m as calculated from the following:   Height as of this encounter: 5\' 1"  (1.549 m).   Weight as of this encounter: 89 kg.  Procedure:  Procedure(s) (LRB): TOTAL KNEE ARTHROPLASTY (Left)   Consults: None  HPI: Savannah Summers is a 53 y.o. year old female with end stage OA of her left knee with progressively worsening pain and dysfunction. She has constant pain, with activity and at rest and significant functional deficits with difficulties even with ADLs. She has had extensive non-op management including analgesics, injections of cortisone and viscosupplements, and home exercise program, but remains in significant pain with significant dysfunction. Radiographs show bone on bone arthritis medial and patellofemoral. She presents now for left Total Knee Arthroplasty.    Laboratory Data: Admission on 04/21/2018, Discharged on 04/22/2018  Component Date Value Ref Range Status  . WBC 04/22/2018 16.5* 4.0 - 10.5 K/uL Final  . RBC 04/22/2018 4.24  3.87 - 5.11 MIL/uL Final  . Hemoglobin 04/22/2018 12.2  12.0 - 15.0 g/dL Final  . HCT 78/29/562101/10/2018 37.6  36.0 - 46.0 % Final  . MCV 04/22/2018 88.7  80.0 - 100.0 fL Final  . MCH 04/22/2018 28.8  26.0 - 34.0 pg Final  . MCHC 04/22/2018 32.4  30.0 - 36.0 g/dL Final  . RDW 30/86/578401/10/2018 12.6  11.5 - 15.5 % Final  . Platelets 04/22/2018 234  150 - 400 K/uL Final  . nRBC 04/22/2018 0.0  0.0 - 0.2 % Final   Performed at [facility], [address] [place], [place] [address]  . Sodium 04/22/2018 139  135 -  145 mmol/L Final  . Potassium 04/22/2018 4.4  3.5 - 5.1 mmol/L Final  . Chloride 04/22/2018 105  98 - 111 mmol/L Final  . CO2 04/22/2018 25  22 - 32 mmol/L Final  . Glucose, Bld 04/22/2018 137* 70 - 99 mg/dL Final  . BUN 52/84/132401/10/2018 16  6 - 20 mg/dL Final  . Creatinine, Ser 04/22/2018 0.91  0.44 - 1.00 mg/dL Final  . Calcium 40/10/272501/10/2018 8.9  8.9 - 10.3 mg/dL Final  . GFR calc non Af Amer 04/22/2018 >60  >60 mL/min Final  . GFR calc Af Amer 04/22/2018 >60  >60 mL/min Final  . Anion gap 04/22/2018 9  5 - 15 Final   Performed at [facility], [address] [place], [place] [address]  Hospital Outpatient Visit on 04/11/2018  Component Date Value Ref Range Status  . aPTT 04/11/2018 27  24 - 36 seconds Final   Performed at [facility], [address] [place], [place] [address]  . WBC 04/11/2018 4.8  4.0 - 10.5 K/uL Final  . RBC 04/11/2018 4.76  3.87 - 5.11 MIL/uL Final  . Hemoglobin 04/11/2018 13.7  12.0 - 15.0 g/dL Final  . HCT 25/95/638712/27/2019 42.7  36.0 - 46.0 % Final  . MCV 04/11/2018 89.7  80.0 - 100.0 fL Final  . MCH 04/11/2018 28.8  26.0 - 34.0 pg Final  . MCHC 04/11/2018 32.1  30.0 - 36.0  g/dL Final  . RDW 21/30/8657 12.7  11.5 - 15.5 % Final  . Platelets 04/11/2018 256  150 - 400 K/uL Final  . nRBC 04/11/2018 0.0  0.0 - 0.2 % Final   Performed at [facility], [address], [place], [place] [address]  . Sodium 04/11/2018 138  135 - 145 mmol/L Final  . Potassium 04/11/2018 4.0  3.5 - 5.1 mmol/L Final  . Chloride 04/11/2018 103  98 - 111 mmol/L Final  . CO2 04/11/2018 28  22 - 32 mmol/L Final  . Glucose, Bld 04/11/2018 99  70 - 99 mg/dL Final  . BUN 29/52/8413 13  6 - 20 mg/dL Final  . Creatinine, Ser 04/11/2018 0.92  0.44 - 1.00 mg/dL Final  . Calcium 24/40/1027 9.3  8.9 - 10.3 mg/dL Final  . Total Protein 04/11/2018 7.3  6.5 - 8.1 g/dL Final  . Albumin 25/36/6440 4.1  3.5 - 5.0 g/dL Final  . AST 34/74/2595 24  15 - 41 U/L  Final  . ALT 04/11/2018 24  0 - 44 U/L Final  . Alkaline Phosphatase 04/11/2018 74  38 - 126 U/L Final  . Total Bilirubin 04/11/2018 1.3* 0.3 - 1.2 mg/dL Final  . GFR calc non Af Amer 04/11/2018 >60  >60 mL/min Final  . GFR calc Af Amer 04/11/2018 >60  >60 mL/min Final  . Anion gap 04/11/2018 7  5 - 15 Final   Performed at [facility], [address] [place], [place] [address]  . Prothrombin Time 04/11/2018 12.9  11.4 - 15.2 seconds Final  . INR 04/11/2018 0.98   Final   Performed at [facility], [address] [place], [place] [address]  . ABO/RH(D) 04/11/2018 O POS   Final  . Antibody Screen 04/11/2018 NEG   Final  . Sample Expiration 04/11/2018 04/24/2018   Final  . Extend sample reason 04/11/2018    Final                   Value:NO TRANSFUSIONS OR PREGNANCY IN THE PAST 3 MONTHS Performed at [facility], [address], [place], [place] [address]   . MRSA, PCR 04/11/2018 NEGATIVE  NEGATIVE Final  . Staphylococcus aureus 04/11/2018 NEGATIVE  NEGATIVE Final   Comment: (NOTE) The Xpert SA Assay (FDA approved for NASAL specimens in patients 82 years of age and older), is one component of a comprehensive surveillance program. It is not intended to diagnose infection nor to guide or monitor treatment. Performed at [facility], [address] [place], [place] [address]   . ABO/RH(D) 04/11/2018    Final                   Value:O POS Performed at [facility], [address] [place], [place] [address]   Office Visit on 04/11/2018  Component Date Value Ref Range Status  . TSH 04/11/2018 4.62* 0.35 - 4.50 uIU/mL Final     X-Rays:No results found.  EKG: Orders placed or performed in visit on 04/11/18  . EKG 12-Lead     Hospital Course: PURVA VESSELL is a 53 y.o. who was admitted to [facility]. They were brought to the operating room on 04/21/2018 and underwent  Procedure(s): TOTAL KNEE ARTHROPLASTY.  Patient tolerated the procedure well and was later transferred to the recovery room and then to the orthopaedic floor for postoperative care. They were given PO and IV analgesics for pain  control following their surgery. They were given 24 hours of postoperative antibiotics of  Anti-infectives (From admission, onward)   Start     Dose/Rate Route Frequency Ordered Stop   04/21/18 1300  ceFAZolin (ANCEF) IVPB 2g/100 mL premix     2 g 200 mL/hr over 30 Minutes Intravenous Every 6 hours 04/21/18 1033 04/21/18 1948   04/21/18 0600  ceFAZolin (ANCEF) IVPB 2g/100 mL premix     2 g 200 mL/hr over 30 Minutes Intravenous On call to O.R. 04/21/18 0540 04/21/18 0720   04/21/18 0559  ceFAZolin (ANCEF) 2-4 GM/100ML-% IVPB    Note to Pharmacy:  Vevelyn Royals  : cabinet override      04/21/18 0559 04/21/18 0720     and started on DVT prophylaxis in the form of Aspirin.   PT and OT were ordered for total joint protocol. Discharge planning consulted to help with postop disposition and equipment needs.  Patient had a good night on the evening of surgery. They started to get up OOB with therapy on POD #0. Pt was seen during rounds and was ready to go home pending progress with therapy. Hemovac drain was pulled without difficulty. She worked with therapy on POD #1 and was meeting her goals. Pt was discharged to home later that day in stable condition.  Diet: Regular diet Activity: WBAT Follow-up: in 2 weeks with Dr. Lequita Halt Disposition: Home with outpatient physical therapy at [facility] Discharged Condition: stable   Discharge Instructions    Call MD / Call 911   Complete by:  As directed    If you experience chest pain or shortness of breath, CALL 911 and be transported to the hospital emergency room.  If you develope a fever above 101 F, pus (white drainage) or increased drainage or redness at the wound, or calf pain, call your surgeon's office.   Change  dressing   Complete by:  As directed    Change dressing on Wednesday, then change the dressing daily with sterile 4 x 4 inch gauze dressing and apply TED hose.   Constipation Prevention   Complete by:  As directed    Drink plenty of fluids.  Prune juice may be helpful.  You may use a stool softener, such as Colace (over the counter) 100 mg twice a day.  Use MiraLax (over the counter) for constipation as needed.   Diet - low sodium heart healthy   Complete by:  As directed    Discharge instructions   Complete by:  As directed    Dr. Ollen Gross Total Joint Specialist [facility] [address], [address] [place], [place] [address] [phone]  TOTAL KNEE REPLACEMENT POSTOPERATIVE DIRECTIONS  Knee Rehabilitation, Guidelines Following Surgery  Results after knee surgery are often greatly improved when you follow the exercise, range of motion and muscle strengthening exercises prescribed by your doctor. Safety measures are also important to protect the knee from further injury. Any time any of these exercises cause you to have increased pain or swelling in your knee joint, decrease the amount until you are comfortable again and slowly increase them. If you have problems or questions, call your caregiver or physical therapist for advice.   HOME CARE INSTRUCTIONS  Remove items at home which could result in a fall. This includes throw rugs or furniture in walking pathways.  ICE to the affected knee every three hours for 30 minutes at a time and then as needed for pain and swelling.  Continue to use ice  on the knee for pain and swelling from surgery. You may notice swelling that will progress down to the foot and ankle.  This is normal after surgery.  Elevate the leg when you are not up walking on it.   Continue to use the breathing machine which will help keep your temperature down.  It is common for your temperature to cycle up and down following surgery, especially at night when you are not  up moving around and exerting yourself.  The breathing machine keeps your lungs expanded and your temperature down. Do not place pillow under knee, focus on keeping the knee straight while resting   DIET You may resume your previous home diet once your are discharged from the hospital.  DRESSING / WOUND CARE / SHOWERING You may shower 3 days after surgery, but keep the wounds dry during showering.  You may use an occlusive plastic wrap (Press'n Seal for example), NO SOAKING/SUBMERGING IN THE BATHTUB.  If the bandage gets wet, change with a clean dry gauze.  If the incision gets wet, pat the wound dry with a clean towel. You may start showering once you are discharged home but do not submerge the incision under water. Just pat the incision dry and apply a dry gauze dressing on daily. Change the surgical dressing daily and reapply a dry dressing each time.  ACTIVITY Walk with your walker as instructed. Use walker as long as suggested by your caregivers. Avoid periods of inactivity such as sitting longer than an hour when not asleep. This helps prevent blood clots.  You may resume a sexual relationship in one month or when given the OK by your doctor.  You may return to work once you are cleared by your doctor.  Do not drive a car for 6 weeks or until released by you surgeon.  Do not drive while taking narcotics.  WEIGHT BEARING Weight bearing as tolerated with assist device (walker, cane, etc) as directed, use it as long as suggested by your surgeon or therapist, typically at least 4-6 weeks.  POSTOPERATIVE CONSTIPATION PROTOCOL Constipation - defined medically as fewer than three stools per week and severe constipation as less than one stool per week.  One of the most common issues patients have following surgery is constipation.  Even if you have a regular bowel pattern at home, your normal regimen is likely to be disrupted due to multiple reasons following surgery.  Combination of  anesthesia, postoperative narcotics, change in appetite and fluid intake all can affect your bowels.  In order to avoid complications following surgery, here are some recommendations in order to help you during your recovery period.  Colace (docusate) - Pick up an over-the-counter form of Colace or another stool softener and take twice a day as long as you are requiring postoperative pain medications.  Take with a full glass of water daily.  If you experience loose stools or diarrhea, hold the colace until you stool forms back up.  If your symptoms do not get better within 1 week or if they get worse, check with your doctor.  Dulcolax (bisacodyl) - Pick up over-the-counter and take as directed by the product packaging as needed to assist with the movement of your bowels.  Take with a full glass of water.  Use this product as needed if not relieved by Colace only.   MiraLax (polyethylene glycol) - Pick up over-the-counter to have on hand.  MiraLax is a solution that will increase the amount of water in  your bowels to assist with bowel movements.  Take as directed and can mix with a glass of water, juice, soda, coffee, or tea.  Take if you go more than two days without a movement. Do not use MiraLax more than once per day. Call your doctor if you are still constipated or irregular after using this medication for 7 days in a row.  If you continue to have problems with postoperative constipation, please contact the office for further assistance and recommendations.  If you experience "the worst abdominal pain ever" or develop nausea or vomiting, please contact the office immediatly for further recommendations for treatment.  ITCHING  If you experience itching with your medications, try taking only a single pain pill, or even half a pain pill at a time.  You can also use Benadryl over the counter for itching or also to help with sleep.   TED HOSE STOCKINGS Wear the elastic stockings on both legs for three  weeks following surgery during the day but you may remove then at night for sleeping.  MEDICATIONS See your medication summary on the "After Visit Summary" that the nursing staff will review with you prior to discharge.  You may have some home medications which will be placed on hold until you complete the course of blood thinner medication.  It is important for you to complete the blood thinner medication as prescribed by your surgeon.  Continue your approved medications as instructed at time of discharge.  PRECAUTIONS If you experience chest pain or shortness of breath - call 911 immediately for transfer to the hospital emergency department.  If you develop a fever greater that 101 F, purulent drainage from wound, increased redness or drainage from wound, foul odor from the wound/dressing, or calf pain - CONTACT YOUR SURGEON.                                                   FOLLOW-UP APPOINTMENTS Make sure you keep all of your appointments after your operation with your surgeon and caregivers. You should call the office at the above phone number and make an appointment for approximately two weeks after the date of your surgery or on the date instructed by your surgeon outlined in the "After Visit Summary".   RANGE OF MOTION AND STRENGTHENING EXERCISES  Rehabilitation of the knee is important following a knee injury or an operation. After just a few days of immobilization, the muscles of the thigh which control the knee become weakened and shrink (atrophy). Knee exercises are designed to build up the tone and strength of the thigh muscles and to improve knee motion. Often times heat used for twenty to thirty minutes before working out will loosen up your tissues and help with improving the range of motion but do not use heat for the first two weeks following surgery. These exercises can be done on a training (exercise) mat, on the floor, on a table or on a bed. Use what ever works the best and is  most comfortable for you Knee exercises include:  Leg Lifts - While your knee is still immobilized in a splint or cast, you can do straight leg raises. Lift the leg to 60 degrees, hold for 3 sec, and slowly lower the leg. Repeat 10-20 times 2-3 times daily. Perform this exercise against resistance later as your knee  gets better.  Quad and Hamstring Sets - Tighten up the muscle on the front of the thigh (Quad) and hold for 5-10 sec. Repeat this 10-20 times hourly. Hamstring sets are done by pushing the foot backward against an object and holding for 5-10 sec. Repeat as with quad sets.  Leg Slides: Lying on your back, slowly slide your foot toward your buttocks, bending your knee up off the floor (only go as far as is comfortable). Then slowly slide your foot back down until your leg is flat on the floor again. Angel Wings: Lying on your back spread your legs to the side as far apart as you can without causing discomfort.  A rehabilitation program following serious knee injuries can speed recovery and prevent re-injury in the future due to weakened muscles. Contact your doctor or a physical therapist for more information on knee rehabilitation.   IF YOU ARE TRANSFERRED TO A SKILLED REHAB FACILITY If the patient is transferred to a skilled rehab facility following release from the hospital, a list of the current medications will be sent to the facility for the patient to continue.  When discharged from the skilled rehab facility, please have the facility set up the patient's Home Health Physical Therapy prior to being released. Also, the skilled facility will be responsible for providing the patient with their medications at time of release from the facility to include their pain medication, the muscle relaxants, and their blood thinner medication. If the patient is still at the rehab facility at time of the two week follow up appointment, the skilled rehab facility will also need to assist the patient in  arranging follow up appointment in our office and any transportation needs.  MAKE SURE YOU:  Understand these instructions.  Get help right away if you are not doing well or get worse.    Pick up stool softner and laxative for home use following surgery while on pain medications. Do not submerge incision under water. Please use good hand washing techniques while changing dressing each day. May shower starting three days after surgery. Please use a clean towel to pat the incision dry following showers. Continue to use ice for pain and swelling after surgery. Do not use any lotions or creams on the incision until instructed by your surgeon.   Do not put a pillow under the knee. Place it under the heel.   Complete by:  As directed    Driving restrictions   Complete by:  As directed    No driving for two weeks   TED hose   Complete by:  As directed    Use stockings (TED hose) for three weeks on both leg(s).  You may remove them at night for sleeping.   Weight bearing as tolerated   Complete by:  As directed      Allergies as of 04/22/2018   No Known Allergies     Medication List    TAKE these medications   aspirin 325 MG EC tablet Take 1 tablet (325 mg total) by mouth 2 (two) times daily for 20 days.   gabapentin 300 MG capsule Commonly known as:  NEURONTIN Take 1 capsule (300 mg total) by mouth 3 (three) times daily.   GLUCOSAMINE 1500 COMPLEX PO Take 1,500 mg by mouth daily. Takes 2 tablets daily.   levothyroxine 75 MCG tablet Commonly known as:  SYNTHROID, LEVOTHROID Take 1 tablet (75 mcg total) by mouth daily.   methocarbamol 500 MG tablet Commonly known as:  ROBAXIN Take 1 tablet (500 mg total) by mouth every 6 (six) hours as needed for muscle spasms.   oxyCODONE 5 MG immediate release tablet Commonly known as:  Oxy IR/ROXICODONE Take 1-2 tablets (5-10 mg total) by mouth every 6 (six) hours as needed for severe pain.   traMADol 50 MG tablet Commonly known as:   ULTRAM Take 1-2 tablets (50-100 mg total) by mouth every 6 (six) hours as needed for moderate pain.            Discharge Care Instructions  (From admission, onward)         Start     Ordered   04/22/18 0000  Weight bearing as tolerated     04/22/18 0701   04/22/18 0000  Change dressing    Comments:  Change dressing on Wednesday, then change the dressing daily with sterile 4 x 4 inch gauze dressing and apply TED hose.   04/22/18 0701         Follow-up Information    Ollen Gross, MD. Schedule an appointment as soon as possible for a visit on 05/06/2018.   Specialty:  Orthopedic Surgery Contact information: [address] [place] [address] [phone]           Signed: Arther Abbott, PA-C Orthopedic Surgery 04/23/2018, 8:10 AM

## 2018-05-04 ENCOUNTER — Other Ambulatory Visit: Payer: Self-pay | Admitting: Family Medicine

## 2018-05-05 NOTE — Telephone Encounter (Signed)
OK to continue this dosage per labs from 04/11/18?

## 2018-05-05 NOTE — Telephone Encounter (Signed)
Yes.  Her TSH was just slightly off.  Refill for 6 months and would repeat her TSH in about 6 months.

## 2018-08-09 ENCOUNTER — Encounter: Payer: Self-pay | Admitting: Family Medicine

## 2018-09-04 ENCOUNTER — Ambulatory Visit (INDEPENDENT_AMBULATORY_CARE_PROVIDER_SITE_OTHER): Payer: PRIVATE HEALTH INSURANCE | Admitting: Family Medicine

## 2018-09-04 ENCOUNTER — Other Ambulatory Visit: Payer: Self-pay

## 2018-09-04 DIAGNOSIS — B001 Herpesviral vesicular dermatitis: Secondary | ICD-10-CM

## 2018-09-04 DIAGNOSIS — H1132 Conjunctival hemorrhage, left eye: Secondary | ICD-10-CM | POA: Diagnosis not present

## 2018-09-04 MED ORDER — VALACYCLOVIR HCL 1 G PO TABS: ORAL_TABLET | ORAL | 0 refills | Status: DC

## 2018-09-04 NOTE — Progress Notes (Signed)
Patient ID: Savannah Summers, female   DOB: 02/11/1966, 53 y.o.   MRN: [id number]  This visit type was conducted due to national recommendations for restrictions regarding the COVID-19 pandemic in an effort to limit this patient's exposure and mitigate transmission in our community.   Virtual Visit via Video Note  I connected with Savannah Summers on 09/04/18 at 11:00 AM EDT by a video enabled telemedicine application and verified that I am speaking with the correct person using two identifiers.  Location patient: home Location provider:work or home office Persons participating in the virtual visit: patient, provider  I discussed the limitations of evaluation and management by telemedicine and the availability of in person appointments. The patient expressed understanding and agreed to proceed.   HPI: Patient called to discuss facial rash.  She states that several days ago she had a cold sore on her left upper lip.  This cleared up then a couple days ago she had another small vesicular lesion that occurred just superior to that.  She states is unusual to get to back to back.  Somewhat she works with had concerns that she may have "shingles ".  She states that she has some tingling with this lesion just like she does with cold sores.  She has not noted any other skin rash anywhere else on the face.  She has been using some topical over-the-counter Abreva.  She does also relate that several days ago she woke up with subconjunctival hemorrhage left eye.  She has not had any vesicles around the eye no eye pain or blurred blurred vision   ROS: See pertinent positives and negatives per HPI.  Past Medical History:  Diagnosis Date  . Allergy   . Arthritis   . Chicken pox   . Hypothyroidism     Past Surgical History:  Procedure Laterality Date  . ABDOMINAL HYSTERECTOMY    . BREAST SURGERY     breast reduction  . 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 Rheum arthritis Mother   . Pulmonary fibrosis Mother   . Arthritis Mother        RA  . Hypothyroidism Mother   . Rheum arthritis Sister   . Arthritis Sister        RA  . Cancer Other        breast  . Breast cancer Neg Hx     SOCIAL HX: Non-smoker   Current Outpatient Medications:  .  gabapentin (NEURONTIN) 300 MG capsule, Take 1 capsule (300 mg total) by mouth 3 (three) times daily., Disp: 84 capsule, Rfl: 0 .  Glucosamine-Chondroit-Vit C-Mn (GLUCOSAMINE 1500 COMPLEX PO), Take 1,500 mg by mouth daily. Takes 2 tablets daily. , Disp: , Rfl:  .  levothyroxine (SYNTHROID, LEVOTHROID) 75 MCG tablet, TAKE 1 TABLET BY MOUTH EVERY DAY, Disp: 30 tablet, Rfl: 5 .  methocarbamol (ROBAXIN) 500 MG tablet, Take 1 tablet (500 mg total) by mouth every 6 (six) hours as needed for muscle spasms., Disp: 40 tablet, Rfl: 0 .  oxyCODONE (OXY IR/ROXICODONE) 5 MG immediate release tablet, Take 1-2 tablets (5-10 mg total) by mouth every 6 (six) hours as needed for severe pain., Disp: 56 tablet, Rfl: 0 .  traMADol (ULTRAM) 50 MG tablet, Take 1-2 tablets (50-100 mg total) by mouth every 6 (six) hours  as needed for moderate pain., Disp: 40 tablet, Rfl: 0 .  valACYclovir (VALTREX) 1000 MG tablet, Take two tablets at onset of cold sore and repeat two in 12 hours., Disp: 30 tablet, Rfl: 0  EXAM:  VITALS per patient if applicable:  GENERAL: alert, oriented, appears well and in no acute distress  HEENT: atraumatic, conjunttiva clear, she has some mild subconjunctival hemorrhage changes left eye but these apparently are already fading  NECK: normal movements of the head and neck  LUNGS: on inspection no signs of respiratory distress, breathing rate appears normal, no obvious gross SOB, gasping or wheezing  CV: no obvious cyanosis  MS: moves all visible extremities  without noticeable abnormality  PSYCH/NEURO: pleasant and cooperative, no obvious depression or anxiety, speech and thought processing grossly intact  Skin; 1 small discrete vesicular lesion just above her left upper lip  ASSESSMENT AND PLAN:  Discussed the following assessment and plan:  Cold sore  Subconjunctival hemorrhage of left eye   Reassurance regarding subconjunctival hemorrhage We discussed possible use of Valtrex 1 g take 2 tablets at onset of cold sore to 12 hours later for future occurrences and prescription sent   I discussed the assessment and treatment plan with the patient. The patient was provided an opportunity to ask questions and all were answered. The patient agreed with the plan and demonstrated an understanding of the instructions.   The patient was advised to call back or seek an in-person evaluation if the symptoms worsen or if the condition fails to improve as anticipated.   Evelena Peat, MD

## 2018-09-11 ENCOUNTER — Other Ambulatory Visit: Payer: Self-pay | Admitting: Family Medicine

## 2018-10-15 ENCOUNTER — Other Ambulatory Visit: Payer: Self-pay | Admitting: Family Medicine

## 2018-11-07 ENCOUNTER — Other Ambulatory Visit: Payer: Self-pay

## 2018-11-07 ENCOUNTER — Other Ambulatory Visit: Payer: Self-pay | Admitting: Family Medicine

## 2018-11-07 ENCOUNTER — Other Ambulatory Visit (INDEPENDENT_AMBULATORY_CARE_PROVIDER_SITE_OTHER): Payer: PRIVATE HEALTH INSURANCE

## 2018-11-07 DIAGNOSIS — R7989 Other specified abnormal findings of blood chemistry: Secondary | ICD-10-CM

## 2018-11-07 NOTE — Telephone Encounter (Signed)
Patient is coming in today for repeat TSH and Free T4

## 2018-11-08 ENCOUNTER — Encounter: Payer: Self-pay | Admitting: Family Medicine

## 2018-11-08 LAB — T4, FREE: Free T4: 1.3 ng/dL (ref 0.8–1.8)

## 2018-11-08 LAB — TSH: TSH: 1.94 mIU/L

## 2019-01-01 ENCOUNTER — Other Ambulatory Visit: Payer: Self-pay | Admitting: Family Medicine

## 2019-03-18 ENCOUNTER — Other Ambulatory Visit: Payer: Self-pay | Admitting: Family Medicine

## 2019-03-18 DIAGNOSIS — Z1231 Encounter for screening mammogram for malignant neoplasm of breast: Secondary | ICD-10-CM

## 2019-03-20 ENCOUNTER — Other Ambulatory Visit: Payer: Self-pay

## 2019-03-20 ENCOUNTER — Ambulatory Visit
Admission: RE | Admit: 2019-03-20 | Discharge: 2019-03-20 | Disposition: A | Discharge status: Home / Self Care | Payer: PRIVATE HEALTH INSURANCE | Source: Ambulatory Visit | Attending: Family Medicine | Admitting: Family Medicine

## 2019-03-20 DIAGNOSIS — Z1231 Encounter for screening mammogram for malignant neoplasm of breast: Secondary | ICD-10-CM

## 2019-05-01 ENCOUNTER — Ambulatory Visit: Payer: PRIVATE HEALTH INSURANCE

## 2019-07-29 ENCOUNTER — Other Ambulatory Visit: Payer: Self-pay | Admitting: Family Medicine

## 2020-01-14 ENCOUNTER — Other Ambulatory Visit: Payer: Self-pay | Admitting: Family Medicine

## 2020-01-18 ENCOUNTER — Encounter: Payer: Self-pay | Admitting: Family Medicine

## 2020-01-18 ENCOUNTER — Other Ambulatory Visit: Payer: Self-pay

## 2020-01-18 ENCOUNTER — Ambulatory Visit (INDEPENDENT_AMBULATORY_CARE_PROVIDER_SITE_OTHER): Payer: PRIVATE HEALTH INSURANCE | Admitting: Family Medicine

## 2020-01-18 VITALS — BP 126/80 | HR 80 | Temp 95.0°F | Ht 61.0 in | Wt 217.6 lb

## 2020-01-18 DIAGNOSIS — E038 Other specified hypothyroidism: Secondary | ICD-10-CM

## 2020-01-18 NOTE — Progress Notes (Signed)
Established Patient Office Visit  Subjective:  Patient ID: Savannah Summers, female    DOB: [birth year]  Age: 54 y.o. MRN: [id number]  CC:  Chief Complaint  Patient presents with  . Medication Management    HPI Savannah Summers presents for medical follow-up.  She has history of hypothyroidism and is on levothyroxine 75 mcg daily.  She has not had lab work in over a year.  She is compliant with therapy.  Denies any overt symptoms of hypothyroidism.  She had left knee replacement couple years ago.  Now is developing some left anterior hip pain.  She has some pain with ambulation and some increased stiffness.  She is concerned there may be some degenerative changes.  Very strong family history of degenerative arthritis.  No recent falls or injury  Past Medical History:  Diagnosis Date  . Allergy   . Arthritis   . Chicken pox   . Hypothyroidism     Past Surgical History:  Procedure Laterality Date  . ABDOMINAL HYSTERECTOMY    . BREAST SURGERY     breast reduction  . TOTAL KNEE ARTHROPLASTY Left 04/21/2018   Procedure: TOTAL KNEE ARTHROPLASTY;  Surgeon: Ollen Gross, MD;  Location: [facility];  Service: Orthopedics;  Laterality: Left;     Family History  Problem Relation Age of Onset  . Rheum arthritis Mother   . Pulmonary fibrosis Mother   . Arthritis Mother        RA  . Hypothyroidism Mother   . Rheum arthritis Sister   . Arthritis Sister        RA  . Cancer Other        breast  . Breast cancer Neg Hx     Social History   Socioeconomic History  . Marital status: Married    Spouse name: Not on file  . Number of children: Not on file  . Years of education: Not on file  . Highest education level: Not on file  Occupational History  . Not on file  Tobacco Use  . Smoking status: Never Smoker  . Smokeless tobacco: Never Used  Vaping Use  . Vaping Use: Never used  Substance and Sexual Activity  . Alcohol use: Yes    Comment: occ  . Drug use: No  . Sexual  activity: Not on file  Other Topics Concern  . Not on file  Social History Narrative  . Not on file   Social Determinants of Health   Financial Resource Strain:   . Difficulty of Paying Living Expenses: Not on file  Food Insecurity:   . Worried About Programme researcher, broadcasting/film/video in the Last Year: Not on file  . Ran Out of Food in the Last Year: Not on file  Transportation Needs:   . Lack of Transportation (Medical): Not on file  . Lack of Transportation (Non-Medical): Not on file  Physical Activity:   . Days of Exercise per Week: Not on file  . Minutes of Exercise per Session: Not on file  Stress:   . Feeling of Stress : Not on file  Social Connections:   . Frequency of Communication with Friends and Family: Not on file  . Frequency of Social Gatherings with Friends and Family: Not on file  . Attends Religious Services: Not on file  . Active Member of Clubs or Organizations: Not on file  . Attends Banker Meetings: Not on file  . Marital Status: Not on file  Intimate Partner  Violence:   . Fear of Current or Ex-Partner: Not on file  . Emotionally Abused: Not on file  . Physically Abused: Not on file  . Sexually Abused: Not on file    Outpatient Medications Prior to Visit  Medication Sig Dispense Refill  . Glucosamine-Chondroit-Vit C-Mn (GLUCOSAMINE 1500 COMPLEX PO) Take 1,500 mg by mouth daily. Takes 2 tablets daily.     Marland Kitchen levothyroxine (SYNTHROID) 75 MCG tablet Take 1 tablet (75 mcg total) by mouth daily. Keep upcoming appointment for refills. 30 tablet 0  . valACYclovir (VALTREX) 1000 MG tablet TAKE TWO TABLETS AT ONSET OF COLD SORE AND REPEAT TWO IN 12 HOURS. 30 tablet 0  . gabapentin (NEURONTIN) 300 MG capsule Take 1 capsule (300 mg total) by mouth 3 (three) times daily. 84 capsule 0  . methocarbamol (ROBAXIN) 500 MG tablet Take 1 tablet (500 mg total) by mouth every 6 (six) hours as needed for muscle spasms. 40 tablet 0  . oxyCODONE (OXY IR/ROXICODONE) 5 MG immediate  release tablet Take 1-2 tablets (5-10 mg total) by mouth every 6 (six) hours as needed for severe pain. 56 tablet 0  . traMADol (ULTRAM) 50 MG tablet Take 1-2 tablets (50-100 mg total) by mouth every 6 (six) hours as needed for moderate pain. 40 tablet 0   No facility-administered medications prior to visit.    No Known Allergies  ROS Review of Systems  Constitutional: Negative for appetite change, chills and fever.  Respiratory: Negative for shortness of breath.   Cardiovascular: Negative for chest pain.  Endocrine: Negative for cold intolerance.      Objective:    Physical Exam Vitals reviewed.  Constitutional:      Appearance: Normal appearance.  Cardiovascular:     Rate and Rhythm: Normal rate and regular rhythm.  Pulmonary:     Effort: Pulmonary effort is normal.     Breath sounds: Normal breath sounds.  Musculoskeletal:     Cervical back: Neck supple.     Comments: Mild pain with internal and external rotation left hip.  No lateral tenderness.  Lymphadenopathy:     Cervical: No cervical adenopathy.  Neurological:     Mental Status: She is alert.     BP 126/80   Pulse 80   Temp (!) 95 F (35 C) (Oral)   Ht 5\' 1"  (1.549 m)   Wt 217 lb 9.6 oz (98.7 kg)   BMI 41.12 kg/m  Wt Readings from Last 3 Encounters:  01/18/20 217 lb 9.6 oz (98.7 kg)  04/21/18 196 lb 3.4 oz (89 kg)  04/11/18 196 lb (88.9 kg)     Health Maintenance Due  Topic Date Due  . Hepatitis C Screening  Never done  . HIV Screening  Never done  . COLONOSCOPY  Never done    There are no preventive care reminders to display for this patient.  Lab Results  Component Value Date   TSH 1.94 11/07/2018   Lab Results  Component Value Date   WBC 16.5 (H) 04/22/2018   HGB 12.2 04/22/2018   HCT 37.6 04/22/2018   MCV 88.7 04/22/2018   PLT 234 04/22/2018   Lab Results  Component Value Date   NA 139 04/22/2018   K 4.4 04/22/2018   CO2 25 04/22/2018   GLUCOSE 137 (H) 04/22/2018   BUN 16  04/22/2018   CREATININE 0.91 04/22/2018   BILITOT 1.3 (H) 04/11/2018   ALKPHOS 74 04/11/2018   AST 24 04/11/2018   ALT 24 04/11/2018  PROT 7.3 04/11/2018   ALBUMIN 4.1 04/11/2018   CALCIUM 8.9 04/22/2018   ANIONGAP 9 04/22/2018   GFR 76.95 05/27/2015   Lab Results  Component Value Date   CHOL 172 05/27/2015   Lab Results  Component Value Date   HDL 47.30 05/27/2015   Lab Results  Component Value Date   LDLCALC 112 (H) 05/27/2015   Lab Results  Component Value Date   TRIG 61.0 05/27/2015   Lab Results  Component Value Date   CHOLHDL 4 05/27/2015   No results found for: HGBA1C    Assessment & Plan:   Problem List Items Addressed This Visit      Unprioritized   Hypothyroid - Primary   Relevant Orders   TSH    -Recheck TSH today -After getting back to lab will either adjust medication or refill at current dosage  No orders of the defined types were placed in this encounter.   Follow-up: No follow-ups on file.    Evelena Peat, MD

## 2020-01-18 NOTE — Patient Instructions (Signed)
Hypothyroidism  Hypothyroidism is when the thyroid gland does not make enough of certain hormones (it is underactive). The thyroid gland is a small gland located in the lower front part of the neck, just in front of the windpipe (trachea). This gland makes hormones that help control how the body uses food for energy (metabolism) as well as how the heart and brain function. These hormones also play a role in keeping your bones strong. When the thyroid is underactive, it produces too little of the hormones thyroxine (T4) and triiodothyronine (T3). What are the causes? This condition may be caused by:  Hashimoto's disease. This is a disease in which the body's disease-fighting system (immune system) attacks the thyroid gland. This is the most common cause.  Viral infections.  Pregnancy.  Certain medicines.  Birth defects.  Past radiation treatments to the head or neck for cancer.  Past treatment with radioactive iodine.  Past exposure to radiation in the environment.  Past surgical removal of part or all of the thyroid.  Problems with a gland in the center of the brain (pituitary gland).  Lack of enough iodine in the diet. What increases the risk? You are more likely to develop this condition if:  You are female.  You have a family history of thyroid conditions.  You use a medicine called lithium.  You take medicines that affect the immune system (immunosuppressants). What are the signs or symptoms? Symptoms of this condition include:  Feeling as though you have no energy (lethargy).  Not being able to tolerate cold.  Weight gain that is not explained by a change in diet or exercise habits.  Lack of appetite.  Dry skin.  Coarse hair.  Menstrual irregularity.  Slowing of thought processes.  Constipation.  Sadness or depression. How is this diagnosed? This condition may be diagnosed based on:  Your symptoms, your medical history, and a physical exam.  Blood  tests. You may also have imaging tests, such as an ultrasound or MRI. How is this treated? This condition is treated with medicine that replaces the thyroid hormones that your body does not make. After you begin treatment, it may take several weeks for symptoms to go away. Follow these instructions at home:  Take over-the-counter and prescription medicines only as told by your health care provider.  If you start taking any new medicines, tell your health care provider.  Keep all follow-up visits as told by your health care provider. This is important. ? As your condition improves, your dosage of thyroid hormone medicine may change. ? You will need to have blood tests regularly so that your health care provider can monitor your condition. Contact a health care provider if:  Your symptoms do not get better with treatment.  You are taking thyroid replacement medicine and you: ? Sweat a lot. ? Have tremors. ? Feel anxious. ? Lose weight rapidly. ? Cannot tolerate heat. ? Have emotional swings. ? Have diarrhea. ? Feel weak. Get help right away if you have:  Chest pain.  An irregular heartbeat.  A rapid heartbeat.  Difficulty breathing. Summary  Hypothyroidism is when the thyroid gland does not make enough of certain hormones (it is underactive).  When the thyroid is underactive, it produces too little of the hormones thyroxine (T4) and triiodothyronine (T3).  The most common cause is Hashimoto's disease, a disease in which the body's disease-fighting system (immune system) attacks the thyroid gland. The condition can also be caused by viral infections, medicine, pregnancy, or past   radiation treatment to the head or neck.  Symptoms may include weight gain, dry skin, constipation, feeling as though you do not have energy, and not being able to tolerate cold.  This condition is treated with medicine to replace the thyroid hormones that your body does not make. This information  is not intended to replace advice given to you by your health care provider. Make sure you discuss any questions you have with your health care provider. Document Revised: 03/15/2017 Document Reviewed: 03/13/2017 Elsevier Patient Education  2020 [employer] Inc[employer]  

## 2020-01-19 ENCOUNTER — Other Ambulatory Visit: Payer: Self-pay

## 2020-01-19 DIAGNOSIS — R7989 Other specified abnormal findings of blood chemistry: Secondary | ICD-10-CM

## 2020-01-19 LAB — TSH: TSH: 7.4 mIU/L — ABNORMAL HIGH

## 2020-01-19 MED ORDER — LEVOTHYROXINE SODIUM 88 MCG PO TABS: 88.0000 ug | ORAL_TABLET | Freq: Every day | ORAL | 3 refills | Status: DC

## 2020-02-08 ENCOUNTER — Other Ambulatory Visit: Payer: Self-pay | Admitting: Family Medicine

## 2020-03-17 NOTE — Addendum Note (Signed)
Addended by: Lerry Liner on: 03/17/2020 03:27 PM   Modules accepted: Orders

## 2020-04-14 ENCOUNTER — Encounter: Payer: Self-pay | Admitting: Family Medicine

## 2020-04-20 ENCOUNTER — Other Ambulatory Visit: Payer: PRIVATE HEALTH INSURANCE

## 2020-04-20 ENCOUNTER — Other Ambulatory Visit: Payer: Self-pay

## 2020-04-20 DIAGNOSIS — R7989 Other specified abnormal findings of blood chemistry: Secondary | ICD-10-CM

## 2020-04-21 ENCOUNTER — Other Ambulatory Visit: Payer: Self-pay

## 2020-04-21 LAB — TSH: TSH: 4.93 u[IU]/mL — ABNORMAL HIGH (ref 0.35–4.50)

## 2020-04-21 MED ORDER — LEVOTHYROXINE SODIUM 100 MCG PO TABS: 100.0000 ug | ORAL_TABLET | Freq: Every day | ORAL | 3 refills | Status: DC

## 2020-04-21 NOTE — Progress Notes (Signed)
Mychart message sent: Thyroid improved, but still under replaced.  Increase Levothyroxine to 100 mcg daily and repeat TSH in 3 months.

## 2020-04-27 ENCOUNTER — Other Ambulatory Visit: Payer: Self-pay

## 2020-04-27 ENCOUNTER — Ambulatory Visit: Payer: PRIVATE HEALTH INSURANCE | Admitting: Family Medicine

## 2020-04-27 ENCOUNTER — Encounter: Payer: Self-pay | Admitting: Family Medicine

## 2020-04-27 VITALS — BP 115/70 | HR 83 | Temp 98.0°F | Ht 61.0 in | Wt 213.0 lb

## 2020-04-27 DIAGNOSIS — Z01818 Encounter for other preprocedural examination: Secondary | ICD-10-CM | POA: Diagnosis not present

## 2020-04-27 DIAGNOSIS — E038 Other specified hypothyroidism: Secondary | ICD-10-CM

## 2020-04-27 NOTE — Progress Notes (Signed)
Established Patient Office Visit  Subjective:  Patient ID: Savannah Summers, female    DOB: 1966-01-20  Age: 55 y.o. MRN: [id number]  CC:  Chief Complaint  Patient presents with  . Medical Clearance    Patient here for surgical clearance, no concerns surgery scheduled for 07/05/20.     HPI Savannah Summers presents for preoperative clearance.  She will be getting right total hip replacement.  She was initially scheduled for surgery on 07/05/2020 but she is in hopes that this can be moved up.  She has hypothyroidism and is on replacement.  Otherwise very healthy.  No heart or lung difficulties.  Non-smoker.  No recent chest pains.  No history of cerebrovascular disease.  No history of diabetes.  No recent fevers or chills.  She has had COVID-vaccine.  Past Medical History:  Diagnosis Date  . Allergy   . Arthritis   . Chicken pox   . Hypothyroidism     Past Surgical History:  Procedure Laterality Date  . ABDOMINAL HYSTERECTOMY    . BREAST SURGERY     breast reduction  . TOTAL KNEE ARTHROPLASTY Left 04/21/2018   Procedure: TOTAL KNEE ARTHROPLASTY;  Surgeon: Ollen Gross, MD;  Location: [facility];  Service: Orthopedics;  Laterality: Left;     Family History  Problem Relation Age of Onset  . Rheum arthritis Mother   . Pulmonary fibrosis Mother   . Arthritis Mother        RA  . Hypothyroidism Mother   . Rheum arthritis Sister   . Arthritis Sister        RA  . Cancer Other        breast  . Breast cancer Neg Hx     Social History   Socioeconomic History  . Marital status: Married    Spouse name: Not on file  . Number of children: Not on file  . Years of education: Not on file  . Highest education level: Not on file  Occupational History  . Not on file  Tobacco Use  . Smoking status: Never Smoker  . Smokeless tobacco: Never Used  Vaping Use  . Vaping Use: Never used  Substance and Sexual Activity  . Alcohol use: Yes    Comment: occ  . Drug use: No  .  Sexual activity: Not on file  Other Topics Concern  . Not on file  Social History Narrative  . Not on file   Social Determinants of Health   Financial Resource Strain: Not on file  Food Insecurity: Not on file  Transportation Needs: Not on file  Physical Activity: Not on file  Stress: Not on file  Social Connections: Not on file  Intimate Partner Violence: Not on file    Outpatient Medications Prior to Visit  Medication Sig Dispense Refill  . levothyroxine (SYNTHROID) 100 MCG tablet Take 1 tablet (100 mcg total) by mouth daily. 90 tablet 3  . valACYclovir (VALTREX) 1000 MG tablet TAKE TWO TABLETS AT ONSET OF COLD SORE AND REPEAT TWO IN 12 HOURS. 30 tablet 0  . Glucosamine-Chondroit-Vit C-Mn (GLUCOSAMINE 1500 COMPLEX PO) Take 1,500 mg by mouth daily. Takes 2 tablets daily.  (Patient not taking: Reported on 04/27/2020)     No facility-administered medications prior to visit.    No Known Allergies  ROS Review of Systems  Constitutional: Negative for chills, fatigue, fever and unexpected weight change.  Eyes: Negative for visual disturbance.  Respiratory: Negative for cough, chest tightness, shortness of breath  and wheezing.   Cardiovascular: Negative for chest pain, palpitations and leg swelling.  Endocrine: Negative for polydipsia and polyuria.  Genitourinary: Negative for dysuria.  Neurological: Negative for dizziness, seizures, syncope, weakness, light-headedness and headaches.      Objective:    Physical Exam Vitals reviewed.  Constitutional:      Appearance: Normal appearance.  Neck:     Vascular: No carotid bruit.  Cardiovascular:     Rate and Rhythm: Normal rate and regular rhythm.  Pulmonary:     Effort: Pulmonary effort is normal.     Breath sounds: Normal breath sounds. No wheezing or rales.  Abdominal:     General: Abdomen is flat. There is no distension.     Palpations: Abdomen is soft.     Tenderness: There is no abdominal tenderness. There is no  guarding or rebound.  Musculoskeletal:     Cervical back: Neck supple.     Right lower leg: No edema.     Left lower leg: No edema.  Lymphadenopathy:     Cervical: No cervical adenopathy.  Neurological:     Mental Status: She is alert.     BP 115/70   Pulse 83   Temp 98 F (36.7 C) (Oral)   Ht 5\' 1"  (1.549 m)   Wt 213 lb (96.6 kg)   SpO2 98%   BMI 40.25 kg/m  Wt Readings from Last 3 Encounters:  04/27/20 213 lb (96.6 kg)  01/18/20 217 lb 9.6 oz (98.7 kg)  04/21/18 196 lb 3.4 oz (89 kg)     Health Maintenance Due  Topic Date Due  . Hepatitis C Screening  Never done  . HIV Screening  Never done  . COLONOSCOPY (Pts 45-3yrs Insurance coverage will need to be confirmed)  Never done  . COVID-19 Vaccine (3 - Booster for Pfizer series) 11/25/2019    There are no preventive care reminders to display for this patient.  Lab Results  Component Value Date   TSH 4.93 (H) 04/20/2020   Lab Results  Component Value Date   WBC 16.5 (H) 04/22/2018   HGB 12.2 04/22/2018   HCT 37.6 04/22/2018   MCV 88.7 04/22/2018   PLT 234 04/22/2018   Lab Results  Component Value Date   NA 139 04/22/2018   K 4.4 04/22/2018   CO2 25 04/22/2018   GLUCOSE 137 (H) 04/22/2018   BUN 16 04/22/2018   CREATININE 0.91 04/22/2018   BILITOT 1.3 (H) 04/11/2018   ALKPHOS 74 04/11/2018   AST 24 04/11/2018   ALT 24 04/11/2018   PROT 7.3 04/11/2018   ALBUMIN 4.1 04/11/2018   CALCIUM 8.9 04/22/2018   ANIONGAP 9 04/22/2018   GFR 76.95 05/27/2015   Lab Results  Component Value Date   CHOL 172 05/27/2015   Lab Results  Component Value Date   HDL 47.30 05/27/2015   Lab Results  Component Value Date   LDLCALC 112 (H) 05/27/2015   Lab Results  Component Value Date   TRIG 61.0 05/27/2015   Lab Results  Component Value Date   CHOLHDL 4 05/27/2015   No results found for: HGBA1C    Assessment & Plan:   Problem List Items Addressed This Visit   None   Visit Diagnoses    Preoperative  clearance    -  Primary   Relevant Orders   CBC with Differential/Platelet   CMP   Hemoglobin A1c   Protime-INR    Generally healthy 55 year old female.  Low risk for complication  from surgery.  Will obtain labs above with CBC, comprehensive metabolic panel, A1c, INR.  No contraindications for surgery.  Reviewed prior EKG 2019.  No orders of the defined types were placed in this encounter.   Follow-up: No follow-ups on file.    Evelena Peat, MD

## 2020-04-28 LAB — COMPREHENSIVE METABOLIC PANEL
ALT: 25 U/L (ref 0–35)
AST: 19 U/L (ref 0–37)
Albumin: 4.7 g/dL (ref 3.5–5.2)
Alkaline Phosphatase: 87 U/L (ref 39–117)
BUN: 19 mg/dL (ref 6–23)
CO2: 29 mEq/L (ref 19–32)
Calcium: 10.2 mg/dL (ref 8.4–10.5)
Chloride: 104 mEq/L (ref 96–112)
Creatinine, Ser: 0.85 mg/dL (ref 0.40–1.20)
GFR: 76.71 mL/min (ref >60.00)
Glucose, Bld: 83 mg/dL (ref 70–99)
Potassium: 4.7 mEq/L (ref 3.5–5.1)
Sodium: 138 mEq/L (ref 135–145)
Total Bilirubin: 0.7 mg/dL (ref 0.2–1.2)
Total Protein: 7.9 g/dL (ref 6.0–8.3)

## 2020-04-28 LAB — CBC WITH DIFFERENTIAL/PLATELET
Basophils Absolute: 0 10*3/uL (ref 0.0–0.1)
Basophils Relative: 0.3 % (ref 0.0–3.0)
Eosinophils Absolute: 0.5 10*3/uL (ref 0.0–0.7)
Eosinophils Relative: 6.9 % — ABNORMAL HIGH (ref 0.0–5.0)
HCT: 43.3 % (ref 36.0–46.0)
Hemoglobin: 14.5 g/dL (ref 12.0–15.0)
Lymphocytes Relative: 31.8 % (ref 12.0–46.0)
Lymphs Abs: 2.4 10*3/uL (ref 0.7–4.0)
MCHC: 33.4 g/dL (ref 30.0–36.0)
MCV: 87 fl (ref 78.0–100.0)
Monocytes Absolute: 1.1 10*3/uL — ABNORMAL HIGH (ref 0.1–1.0)
Monocytes Relative: 14 % — ABNORMAL HIGH (ref 3.0–12.0)
Neutro Abs: 3.6 10*3/uL (ref 1.4–7.7)
Neutrophils Relative %: 47 % (ref 43.0–77.0)
Platelets: 283 10*3/uL (ref 150.0–400.0)
RBC: 4.97 Mil/uL (ref 3.87–5.11)
RDW: 13.4 % (ref 11.5–15.5)
WBC: 7.7 10*3/uL (ref 4.0–10.5)

## 2020-04-28 LAB — HEMOGLOBIN A1C: Hgb A1c MFr Bld: 6 % (ref 4.6–6.5)

## 2020-04-28 LAB — PROTIME-INR
INR: 1 ratio (ref 0.8–1.0)
Prothrombin Time: 11.7 s (ref 9.6–13.1)

## 2020-04-28 NOTE — Progress Notes (Signed)
Mychart message sent: Labs all stable/normal.   I will complete forms and get them faxed to her surgeon tomorrow.

## 2020-07-01 ENCOUNTER — Other Ambulatory Visit: Payer: Self-pay

## 2020-07-01 DIAGNOSIS — E038 Other specified hypothyroidism: Secondary | ICD-10-CM

## 2020-07-02 LAB — TSH
TSH: 3 (ref 0.41–5.90)
TSH: 3.57 (ref 0.41–5.90)

## 2020-07-15 ENCOUNTER — Encounter: Payer: Self-pay | Admitting: Family Medicine

## 2021-03-13 ENCOUNTER — Other Ambulatory Visit: Payer: Self-pay | Admitting: Family Medicine

## 2021-05-31 ENCOUNTER — Other Ambulatory Visit: Payer: Self-pay | Admitting: Family Medicine

## 2021-06-22 ENCOUNTER — Other Ambulatory Visit: Payer: Self-pay | Admitting: Family Medicine

## 2021-07-18 ENCOUNTER — Ambulatory Visit: Payer: No Typology Code available for payment source | Admitting: Family Medicine

## 2021-07-18 VITALS — BP 139/85 | HR 74 | Temp 97.9°F | Wt 217.9 lb

## 2021-07-18 DIAGNOSIS — E039 Hypothyroidism, unspecified: Secondary | ICD-10-CM

## 2021-07-18 DIAGNOSIS — Z8619 Personal history of other infectious and parasitic diseases: Secondary | ICD-10-CM | POA: Diagnosis not present

## 2021-07-18 MED ORDER — LEVOTHYROXINE SODIUM 100 MCG PO TABS: 100.0000 ug | ORAL_TABLET | Freq: Every day | ORAL | 3 refills | Status: DC

## 2021-07-18 MED ORDER — VALACYCLOVIR HCL 1 G PO TABS: ORAL_TABLET | ORAL | 1 refills | Status: AC

## 2021-07-18 NOTE — Progress Notes (Signed)
? ?Established Patient Office Visit ? ?Subjective:  ?Patient ID: Savannah Summers, female    DOB: 10-09-1965  Age: 56 y.o. MRN: [id number] ? ?CC:  ?Chief Complaint  ?Patient presents with  ? Medication Refill  ?  For thyroid  ? ? ?HPI ?Savannah Summers presents for refills of couple medications.  She has hypothyroidism and is on levothyroxine 100 mcg daily.  Needs follow-up labs soon.  Has been taking her medication regularly.  Generally feels well.  She has gained some weight during the past year and hopes to start weight loss program soon.  Also has history of recurrent cold sores.  She takes Valtrex as needed.  Requesting refill. ? ?She and her husband bought a property at [place] during the past year and they plan to eventually retire there.  They are currently renting an apartment here.  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 ?Past Medical History:  ?Diagnosis Date  ? Allergy   ? Arthritis   ? Chicken pox   ? Hypothyroidism   ? ? ?Past Surgical History:  ?Procedure Laterality Date  ? ABDOMINAL HYSTERECTOMY    ? BREAST SURGERY    ? breast reduction  ? TOTAL KNEE ARTHROPLASTY Left 04/21/2018  ? Procedure: TOTAL KNEE ARTHROPLASTY;  Surgeon: Ollen Gross, MD;  Location: [facility];  Service: Orthopedics;  Laterality: Left;   ? ? ?Family History  ?Problem Relation Age of Onset  ? Rheum arthritis Mother   ? Pulmonary fibrosis Mother   ? Arthritis Mother   ?     RA  ? Hypothyroidism Mother   ? Rheum arthritis Sister   ? Arthritis Sister   ?     RA  ? Cancer Other   ?     breast  ? Breast cancer Neg Hx   ? ? ?Social History  ? ?Socioeconomic History  ? Marital status: Married  ?  Spouse name: Not on file  ? Number of children: Not on file  ? Years of education: Not on file  ? Highest education level: Not on file  ?Occupational History  ? Not on file  ?Tobacco Use  ? Smoking status: Never  ? Smokeless tobacco: Never  ?Vaping Use  ? Vaping Use: Never used  ?Substance and Sexual Activity  ? Alcohol use: Yes  ?   Comment: occ  ? Drug use: No  ? Sexual activity: Not on file  ?Other Topics Concern  ? Not on file  ?Social History Narrative  ? Not on file  ? ?Social Determinants of Health  ? ?Financial Resource Strain: Not on file  ?Food Insecurity: Not on file  ?Transportation Needs: Not on file  ?Physical Activity: Not on file  ?Stress: Not on file  ?Social Connections: Not on file  ?Intimate Partner Violence: Not on file  ? ? ?Outpatient Medications Prior to Visit  ?Medication Sig Dispense Refill  ? valACYclovir (VALTREX) 1000 MG tablet TAKE TWO TABLETS AT ONSET OF COLD SORE AND REPEAT TWO IN 12 HOURS. 30 tablet 0  ? Glucosamine-Chondroit-Vit C-Mn (GLUCOSAMINE 1500 COMPLEX PO) Take 1,500 mg by mouth daily. Takes 2 tablets daily.  (Patient not taking: Reported on 04/27/2020)    ? levothyroxine (SYNTHROID) 100 MCG tablet TAKE 1 TABLET BY MOUTH EVERY DAY 30 tablet 0  ? ?No facility-administered medications prior to visit.  ? ? ?No Known Allergies ? ?ROS ?Review of Systems  ?Constitutional:  Negative for appetite change.  ?Respiratory:  Negative for cough and shortness  of breath.   ?Cardiovascular:  Negative for chest pain.  ?Gastrointestinal:  Negative for abdominal pain.  ?Endocrine: Negative for cold intolerance and heat intolerance.  ? ?  ?Objective:  ?  ?Physical Exam ?Vitals reviewed.  ?Cardiovascular:  ?   Rate and Rhythm: Normal rate and regular rhythm.  ?Pulmonary:  ?   Effort: Pulmonary effort is normal.  ?   Breath sounds: Normal breath sounds. No wheezing or rales.  ?Neurological:  ?   Mental Status: She is alert.  ? ? ?BP 139/85 (BP Location: Right Arm, Patient Position: Sitting, Cuff Size: Normal)   Pulse 74   Temp 97.9 ?F (36.6 ?C) (Oral)   Wt 217 lb 14.4 oz (98.8 kg)   SpO2 100%   BMI 41.17 kg/m?  ?Wt Readings from Last 3 Encounters:  ?07/18/21 217 lb 14.4 oz (98.8 kg)  ?04/27/20 213 lb (96.6 kg)  ?01/18/20 217 lb 9.6 oz (98.7 kg)  ? ? ? ?Health Maintenance Due  ?Topic Date Due  ? HIV Screening  Never done  ?  Hepatitis C Screening  Never done  ? COLONOSCOPY (Pts 45-39yrs Insurance coverage will need to be confirmed)  Never done  ? Zoster Vaccines- Shingrix (1 of 2) Never done  ? COVID-19 Vaccine (3 - Booster for Pfizer series) 07/23/2019  ? MAMMOGRAM  03/19/2021  ? ? ?There are no preventive care reminders to display for this patient. ? ?Lab Results  ?Component Value Date  ? TSH 3.00 07/02/2020  ? TSH 3.57 07/02/2020  ? ?Lab Results  ?Component Value Date  ? WBC 7.7 04/27/2020  ? HGB 14.5 04/27/2020  ? HCT 43.3 04/27/2020  ? MCV 87.0 04/27/2020  ? PLT 283.0 04/27/2020  ? ?Lab Results  ?Component Value Date  ? NA 138 04/27/2020  ? K 4.7 04/27/2020  ? CO2 29 04/27/2020  ? GLUCOSE 83 04/27/2020  ? BUN 19 04/27/2020  ? CREATININE 0.85 04/27/2020  ? BILITOT 0.7 04/27/2020  ? ALKPHOS 87 04/27/2020  ? AST 19 04/27/2020  ? ALT 25 04/27/2020  ? PROT 7.9 04/27/2020  ? ALBUMIN 4.7 04/27/2020  ? CALCIUM 10.2 04/27/2020  ? ANIONGAP 9 04/22/2018  ? GFR 76.71 04/27/2020  ? ?Lab Results  ?Component Value Date  ? CHOL 172 05/27/2015  ? ?Lab Results  ?Component Value Date  ? HDL 47.30 05/27/2015  ? ?Lab Results  ?Component Value Date  ? LDLCALC 112 (H) 05/27/2015  ? ?Lab Results  ?Component Value Date  ? TRIG 61.0 05/27/2015  ? ?Lab Results  ?Component Value Date  ? CHOLHDL 4 05/27/2015  ? ?Lab Results  ?Component Value Date  ? HGBA1C 6.0 04/27/2020  ? ? ?  ?Assessment & Plan:  ? ?#1 hypothyroidism.  Needs follow-up labs.  Our lab was closed.  Future lab order for TSH placed and she will schedule this for this Thursday.  Refill levothyroxine for 1 year ? ?#2 history of recurrent cold sores.  Refill Valtrex 1 g take 2 at onset of cold sore and repeat 2 and 12 hours as needed ? ? ?Meds ordered this encounter  ?Medications  ? levothyroxine (SYNTHROID) 100 MCG tablet  ?  Sig: Take 1 tablet (100 mcg total) by mouth daily.  ?  Dispense:  90 tablet  ?  Refill:  3  ? valACYclovir (VALTREX) 1000 MG tablet  ?  Sig: Take two tablets at onset of cold  sore and repeat two in twelve hours.  ?  Dispense:  30 tablet  ?  Refill:  1  ? ? ?Follow-up: No follow-ups on file.  ? ? ?Evelena PeatBruce Liany Mumpower, MD ?

## 2021-07-20 ENCOUNTER — Other Ambulatory Visit: Payer: No Typology Code available for payment source

## 2021-07-25 ENCOUNTER — Other Ambulatory Visit (INDEPENDENT_AMBULATORY_CARE_PROVIDER_SITE_OTHER): Payer: No Typology Code available for payment source

## 2021-07-25 ENCOUNTER — Other Ambulatory Visit: Payer: Self-pay | Admitting: Family Medicine

## 2021-07-25 DIAGNOSIS — E039 Hypothyroidism, unspecified: Secondary | ICD-10-CM | POA: Diagnosis not present

## 2021-07-26 LAB — TSH: TSH: 3.41 u[IU]/mL (ref 0.35–5.50)

## 2021-10-25 ENCOUNTER — Other Ambulatory Visit: Payer: Self-pay | Admitting: Family Medicine

## 2021-10-25 DIAGNOSIS — Z1231 Encounter for screening mammogram for malignant neoplasm of breast: Secondary | ICD-10-CM

## 2021-11-01 ENCOUNTER — Ambulatory Visit
Admission: RE | Admit: 2021-11-01 | Discharge: 2021-11-01 | Disposition: A | Discharge status: Home / Self Care | Payer: No Typology Code available for payment source | Source: Ambulatory Visit | Attending: Family Medicine | Admitting: Family Medicine

## 2021-11-01 DIAGNOSIS — Z1231 Encounter for screening mammogram for malignant neoplasm of breast: Secondary | ICD-10-CM

## 2022-06-25 ENCOUNTER — Encounter: Payer: Self-pay | Admitting: Family Medicine

## 2022-06-25 ENCOUNTER — Ambulatory Visit (INDEPENDENT_AMBULATORY_CARE_PROVIDER_SITE_OTHER): Payer: BC Managed Care – PPO | Admitting: Family Medicine

## 2022-06-25 VITALS — BP 118/70 | HR 80 | Temp 97.8°F | Ht 62.99 in | Wt 227.6 lb

## 2022-06-25 DIAGNOSIS — E038 Other specified hypothyroidism: Secondary | ICD-10-CM | POA: Diagnosis not present

## 2022-06-25 DIAGNOSIS — Z0000 Encounter for general adult medical examination without abnormal findings: Secondary | ICD-10-CM

## 2022-06-25 DIAGNOSIS — R7303 Prediabetes: Secondary | ICD-10-CM | POA: Diagnosis not present

## 2022-06-25 DIAGNOSIS — Z1211 Encounter for screening for malignant neoplasm of colon: Secondary | ICD-10-CM | POA: Diagnosis not present

## 2022-06-25 DIAGNOSIS — Z1159 Encounter for screening for other viral diseases: Secondary | ICD-10-CM | POA: Diagnosis not present

## 2022-06-25 DIAGNOSIS — Z23 Encounter for immunization: Secondary | ICD-10-CM

## 2022-06-25 DIAGNOSIS — R748 Abnormal levels of other serum enzymes: Secondary | ICD-10-CM

## 2022-06-25 DIAGNOSIS — R7309 Other abnormal glucose: Secondary | ICD-10-CM

## 2022-06-25 LAB — BASIC METABOLIC PANEL
BUN: 12 mg/dL (ref 6–23)
CO2: 26 mEq/L (ref 19–32)
Calcium: 9.9 mg/dL (ref 8.4–10.5)
Chloride: 104 mEq/L (ref 96–112)
Creatinine, Ser: 0.95 mg/dL (ref 0.40–1.20)
GFR: 66.11 mL/min (ref >60.00)
Glucose, Bld: 151 mg/dL — ABNORMAL HIGH (ref 70–99)
Potassium: 4.3 mEq/L (ref 3.5–5.1)
Sodium: 139 mEq/L (ref 135–145)

## 2022-06-25 LAB — TSH: TSH: 10.87 u[IU]/mL — ABNORMAL HIGH (ref 0.35–5.50)

## 2022-06-25 LAB — HEPATIC FUNCTION PANEL
ALT: 60 U/L — ABNORMAL HIGH (ref 0–35)
AST: 44 U/L — ABNORMAL HIGH (ref 0–37)
Albumin: 4.1 g/dL (ref 3.5–5.2)
Alkaline Phosphatase: 119 U/L — ABNORMAL HIGH (ref 39–117)
Bilirubin, Direct: 0.1 mg/dL (ref 0.0–0.3)
Total Bilirubin: 0.7 mg/dL (ref 0.2–1.2)
Total Protein: 7.3 g/dL (ref 6.0–8.3)

## 2022-06-25 LAB — LIPID PANEL
Cholesterol: 213 mg/dL — ABNORMAL HIGH (ref 0–200)
HDL: 48.5 mg/dL (ref >39.00)
LDL Cholesterol: 144 mg/dL — ABNORMAL HIGH (ref 0–99)
NonHDL: 164.74
Total CHOL/HDL Ratio: 4
Triglycerides: 106 mg/dL (ref 0.0–149.0)
VLDL: 21.2 mg/dL (ref 0.0–40.0)

## 2022-06-25 LAB — CBC WITH DIFFERENTIAL/PLATELET
Basophils Absolute: 0 10*3/uL (ref 0.0–0.1)
Basophils Relative: 1.1 % (ref 0.0–3.0)
Eosinophils Absolute: 0.2 10*3/uL (ref 0.0–0.7)
Eosinophils Relative: 5.7 % — ABNORMAL HIGH (ref 0.0–5.0)
HCT: 44.4 % (ref 36.0–46.0)
Hemoglobin: 15.2 g/dL — ABNORMAL HIGH (ref 12.0–15.0)
Lymphocytes Relative: 34.4 % (ref 12.0–46.0)
Lymphs Abs: 1.5 10*3/uL (ref 0.7–4.0)
MCHC: 34.3 g/dL (ref 30.0–36.0)
MCV: 89.5 fl (ref 78.0–100.0)
Monocytes Absolute: 0.5 10*3/uL (ref 0.1–1.0)
Monocytes Relative: 11.8 % (ref 3.0–12.0)
Neutro Abs: 2 10*3/uL (ref 1.4–7.7)
Neutrophils Relative %: 47 % (ref 43.0–77.0)
Platelets: 227 10*3/uL (ref 150.0–400.0)
RBC: 4.95 Mil/uL (ref 3.87–5.11)
RDW: 13 % (ref 11.5–15.5)
WBC: 4.3 10*3/uL (ref 4.0–10.5)

## 2022-06-25 LAB — HEMOGLOBIN A1C: Hgb A1c MFr Bld: 6.5 % (ref 4.6–6.5)

## 2022-06-25 MED ORDER — ZEPBOUND 2.5 MG/0.5ML ~~LOC~~ SOAJ: 2.5000 mg | SUBCUTANEOUS | 1 refills | Status: DC

## 2022-06-25 NOTE — Progress Notes (Signed)
Established Patient Office Visit  Subjective   Patient ID: Savannah Summers, female    DOB: 1965-11-17  Age: 56 y.o. MRN: [id number]  Chief Complaint  Patient presents with   Annual Exam    HPI   Savannah Summers seen today for physical exam.  She and her husband bought a place at [place] over a year ago and the plan to eventually retire there.  They go there frequently on the weekends now.  She has history of hypothyroidism and is on replacement.  Takes no other regular medications.  She has been frustrated with difficulties losing weight.  She has tried very low calorie diets and tends to eat very little sugar and starch and still has difficulty losing weight.  She tries to walk frequently.  Has had some previous success with weight watchers but as soon as she went off that she had rapid weight regain.  She specifically is interested in GLP-1 medication class.  She does have a history of prediabetes range blood sugars with prior A1c 6.0%.  Her BMI is over 40.  Health maintenance reviewed  -No history of colonoscopy.  She has declined previously.  She does agree to [employer] at this time.  We did discuss specificity and sensitivity of this test and she is aware of roughly 10% risk of false negatives and false positives. -No history of shingles vaccine.  She declines at this time -No history of hepatitis C screening.  She is low risk -Needs tetanus booster -No indication for further Pap smears with prior hysterectomy -Mammogram is up-to-date  Social history she is married.  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  She has never smoked.  No regular alcohol.  Family history-significant for sister and possibly mother with rheumatoid arthritis.  Her father died of complications of lung cancer and also had hypertension.  She has another sister without any active medical problems.  Her mother also had history of hypothyroidism and pulmonary fibrosis which may  have been related to the RA.  Past Medical History:  Diagnosis Date   Allergy    Arthritis    Chicken pox    Hypothyroidism    Past Surgical History:  Procedure Laterality Date   ABDOMINAL HYSTERECTOMY     BREAST SURGERY     breast reduction   REDUCTION MAMMAPLASTY     TOTAL KNEE ARTHROPLASTY Left 04/21/2018   Procedure: TOTAL KNEE ARTHROPLASTY;  Surgeon: Gaynelle Arabian, MD;  Location: [facility];  Service: Orthopedics;  Laterality: Left;  8mn    reports that she has never smoked. She has never used smokeless tobacco. She reports current alcohol use. She reports that she does not use drugs. family history includes Arthritis in her mother and sister; Breast cancer in her maternal grandmother; Cancer in an other family member; Cancer (age of onset: 669 in her father; Hypertension in her father; Hypothyroidism in her mother; Pulmonary fibrosis in her mother; Rheum arthritis in her mother and sister. No Known Allergies ' Review of Systems  Constitutional:  Negative for chills, fever, malaise/fatigue and weight loss.  HENT:  Negative for hearing loss.   Eyes:  Negative for blurred vision and double vision.  Respiratory:  Negative for cough and shortness of breath.   Cardiovascular:  Negative for chest pain, palpitations and leg swelling.  Gastrointestinal:  Negative for abdominal pain, blood in stool, constipation and diarrhea.  Genitourinary:  Negative for dysuria.  Skin:  Negative for rash.  Neurological:  Negative for dizziness, speech change, seizures, loss of consciousness and headaches.  Psychiatric/Behavioral:  Negative for depression.       Objective:     BP 118/70 (BP Location: Left Arm, Patient Position: Sitting, Cuff Size: Large)   Pulse 80   Temp 97.8 F (36.6 C) (Oral)   Ht 5' 2.99" (1.6 m)   Wt 227 lb 9.6 oz (103.2 kg)   SpO2 98%   BMI 40.33 kg/m    Physical Exam Vitals reviewed.  Constitutional:      Appearance: She is well-developed.  HENT:     Head:  Normocephalic and atraumatic.  Eyes:     Pupils: Pupils are equal, round, and reactive to light.  Neck:     Thyroid: No thyromegaly.  Cardiovascular:     Rate and Rhythm: Normal rate and regular rhythm.     Heart sounds: Normal heart sounds. No murmur heard. Pulmonary:     Effort: No respiratory distress.     Breath sounds: Normal breath sounds. No wheezing or rales.  Abdominal:     General: Bowel sounds are normal. There is no distension.     Palpations: Abdomen is soft. There is no mass.     Tenderness: There is no abdominal tenderness. There is no guarding or rebound.  Musculoskeletal:        General: Normal range of motion.     Cervical back: Normal range of motion and neck supple.     Right lower leg: No edema.     Left lower leg: No edema.  Lymphadenopathy:     Cervical: No cervical adenopathy.  Skin:    Findings: No rash.  Neurological:     Mental Status: She is alert and oriented to person, place, and time.     Cranial Nerves: No cranial nerve deficit.     Deep Tendon Reflexes: Reflexes normal.  Psychiatric:        Behavior: Behavior normal.        Thought Content: Thought content normal.        Judgment: Judgment normal.      No results found for any visits on 06/25/22.    The ASCVD Risk score (Arnett DK, et al., 2019) failed to calculate for the following reasons:   Cannot find a previous HDL lab   Cannot find a previous total cholesterol lab    Assessment & Plan:   #1 physical exam.  Patient has history of hypothyroidism and is on replacement.  We discussed the following health maintenance items  -Continue with at least every other year mammogram -No indication for further Pap smears with prior hysterectomy -Tdap given today -Set up Cologuard.  She declines colonoscopy screening -Check hepatitis C antibody -Discussed Shingrix vaccine and she will consider after researching this. -Obtain follow-up labs including A1c with prior history of prediabetes  range blood sugars.  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  We had a long discussion regarding weight loss strategies.  She has had previous limited success with weight watchers.  She has tried very low calorie diets and regular exercise without much success.  She specifically had interest in medication such as Zepbound.  We explained that insurance coverage may be a challenge.  She does not have any history of pancreatitis or other contraindication.  She does have a BMI over 40.  Start Zepbound 2.5 mg subcutaneous once weekly and give feedback in 1 month.  If tolerating well that point consider  further titration of 5 mg.  Set up 35-monthoffice follow-up.  We did review potential side effects with this medication   Return in about 2 months (around 08/25/2022).    BCarolann Littler MD

## 2022-06-26 LAB — HEPATITIS C ANTIBODY: Hepatitis C Ab: NONREACTIVE

## 2022-06-26 MED ORDER — LEVOTHYROXINE SODIUM 112 MCG PO TABS: 112.0000 ug | ORAL_TABLET | Freq: Every day | ORAL | 0 refills | Status: DC

## 2022-06-26 NOTE — Addendum Note (Signed)
Addended by: Nilda Riggs on: 06/26/2022 10:21 AM   Modules accepted: Orders

## 2022-07-03 DIAGNOSIS — Z1211 Encounter for screening for malignant neoplasm of colon: Secondary | ICD-10-CM | POA: Diagnosis not present

## 2022-07-05 ENCOUNTER — Other Ambulatory Visit: Payer: Self-pay | Admitting: Family Medicine

## 2022-07-08 LAB — COLOGUARD: COLOGUARD: NEGATIVE

## 2022-07-20 ENCOUNTER — Encounter: Payer: Self-pay | Admitting: Family Medicine

## 2022-07-23 ENCOUNTER — Telehealth: Payer: Self-pay | Admitting: Family Medicine

## 2022-07-23 DIAGNOSIS — E669 Obesity, unspecified: Secondary | ICD-10-CM

## 2022-07-23 NOTE — Telephone Encounter (Signed)
Please see telephone encounter

## 2022-07-23 NOTE — Telephone Encounter (Signed)
I spoke with the patient and informed her that her Zepbound 2.5 mg was in need of a Quantity limit exception and this was denied due to the patient not missing 2 or more dosages of the prescribed medication. Patient inquired of Zepbound 5 mg can be sent in to see if they are willing to cover this dosage?

## 2022-07-23 NOTE — Telephone Encounter (Signed)
PA needed for tirzepatide (ZEPBOUND) 2.5 MG/0.5ML Pen requesting up to 6 months

## 2022-07-24 NOTE — Telephone Encounter (Signed)
Pt states per her insurance, the only way she can continue the zepbound if is it's increased to 5mg  as the 2.5 is only covered for the first 4 weeks.

## 2022-07-25 MED ORDER — ZEPBOUND 5 MG/0.5ML ~~LOC~~ SOAJ: 5.0000 mg | SUBCUTANEOUS | 0 refills | Status: DC

## 2022-07-25 NOTE — Addendum Note (Signed)
Addended by: Karie Georges on: 07/25/2022 08:41 AM   Modules accepted: Orders

## 2022-07-25 NOTE — Telephone Encounter (Signed)
Noted and patient notified via Mychart

## 2022-07-25 NOTE — Telephone Encounter (Signed)
Script sent  

## 2022-08-15 ENCOUNTER — Ambulatory Visit: Payer: BC Managed Care – PPO | Admitting: Family Medicine

## 2022-08-15 ENCOUNTER — Encounter: Payer: Self-pay | Admitting: Family Medicine

## 2022-08-15 ENCOUNTER — Ambulatory Visit (INDEPENDENT_AMBULATORY_CARE_PROVIDER_SITE_OTHER): Payer: BC Managed Care – PPO | Admitting: Family Medicine

## 2022-08-15 VITALS — BP 120/80 | HR 82 | Temp 98.1°F | Wt 211.0 lb

## 2022-08-15 DIAGNOSIS — E039 Hypothyroidism, unspecified: Secondary | ICD-10-CM

## 2022-08-15 DIAGNOSIS — E669 Obesity, unspecified: Secondary | ICD-10-CM | POA: Diagnosis not present

## 2022-08-15 MED ORDER — ZEPBOUND 5 MG/0.5ML ~~LOC~~ SOAJ: 5.0000 mg | SUBCUTANEOUS | 2 refills | Status: DC

## 2022-08-15 NOTE — Progress Notes (Signed)
   Subjective:    Patient ID: Savannah Summers, female    DOB: July 10, 1965, 57 y.o.   MRN: [id number]  HPI Here to follow up on obesity and hypothyroidism. She was here for a well exam with Dr. Caryl Never on 06-25-22, and her weight was 227. Her A1c had gone up to 6.5%, and her TSH had jumped up to 10-87. She had no specific complaints, other then feeling sluggish and tired all the time. She was started on Zepbound at 2.5 mg weekly for 4 weeks. She did well on this, so she was given 4 weeks of the 5 mg weekly dose. Her weight has now dropped 17 lbs, and she feels much better. Her energy levels are back up. Also after the wellness labs came back. Her Levothyroxine was increased from 100 mcg to 112 mcg daily.    Review of Systems  Constitutional: Negative.   Respiratory: Negative.    Cardiovascular: Negative.        Objective:   Physical Exam Constitutional:      Appearance: She is obese. She is not ill-appearing.  Cardiovascular:     Rate and Rhythm: Normal rate and regular rhythm.     Pulses: Normal pulses.     Heart sounds: Normal heart sounds.  Pulmonary:     Effort: Pulmonary effort is normal.     Breath sounds: Normal breath sounds.  Neurological:     Mental Status: She is alert.           Assessment & Plan:  She is responding quite well to the Zepbound, and we agreed to maintain her at the 5 mg per week dose. I sent in refills for the next 3 months. She will plan to come back in 2 weeks to recheck a thyroid panel. We will recheck the A1c in mid June.  Gershon Crane, MD

## 2022-08-30 ENCOUNTER — Encounter: Payer: Self-pay | Admitting: Family Medicine

## 2022-09-03 ENCOUNTER — Other Ambulatory Visit (INDEPENDENT_AMBULATORY_CARE_PROVIDER_SITE_OTHER): Payer: BC Managed Care – PPO

## 2022-09-03 DIAGNOSIS — E039 Hypothyroidism, unspecified: Secondary | ICD-10-CM

## 2022-09-03 DIAGNOSIS — R7309 Other abnormal glucose: Secondary | ICD-10-CM

## 2022-09-03 DIAGNOSIS — R748 Abnormal levels of other serum enzymes: Secondary | ICD-10-CM

## 2022-09-04 LAB — T4, FREE: Free T4: 1.46 ng/dL (ref 0.60–1.60)

## 2022-09-04 LAB — TSH: TSH: 0.09 u[IU]/mL — ABNORMAL LOW (ref 0.35–5.50)

## 2022-09-04 LAB — T3, FREE: T3, Free: 3.3 pg/mL (ref 2.3–4.2)

## 2022-09-04 NOTE — Telephone Encounter (Signed)
I would just get the labs that are ordered (she does not need to be fasting). She can talk to Dr. Caryl Never about other routine labs when he gets back

## 2022-09-05 ENCOUNTER — Encounter: Payer: Self-pay | Admitting: Family Medicine

## 2022-09-05 ENCOUNTER — Other Ambulatory Visit: Payer: BC Managed Care – PPO

## 2022-09-06 NOTE — Telephone Encounter (Signed)
I have found that getting to a dose where free T3 and free T4 are in the normal range will frequently drive the TSH to be slightly low. This is not a problem

## 2022-09-12 ENCOUNTER — Other Ambulatory Visit: Payer: Self-pay | Admitting: Family Medicine

## 2022-09-26 ENCOUNTER — Encounter: Payer: Self-pay | Admitting: Family Medicine

## 2022-09-28 MED ORDER — ZEPBOUND 7.5 MG/0.5ML ~~LOC~~ SOAJ: 7.5000 mg | SUBCUTANEOUS | 0 refills | Status: DC

## 2022-09-28 NOTE — Telephone Encounter (Signed)
Pt Rx sent per Dr Caryl Never

## 2022-10-03 ENCOUNTER — Other Ambulatory Visit (INDEPENDENT_AMBULATORY_CARE_PROVIDER_SITE_OTHER): Payer: BC Managed Care – PPO

## 2022-10-03 DIAGNOSIS — R748 Abnormal levels of other serum enzymes: Secondary | ICD-10-CM | POA: Diagnosis not present

## 2022-10-03 DIAGNOSIS — R7309 Other abnormal glucose: Secondary | ICD-10-CM | POA: Diagnosis not present

## 2022-10-04 LAB — HEPATIC FUNCTION PANEL
ALT: 33 U/L (ref 0–35)
AST: 25 U/L (ref 0–37)
Albumin: 4.2 g/dL (ref 3.5–5.2)
Alkaline Phosphatase: 74 U/L (ref 39–117)
Bilirubin, Direct: 0.2 mg/dL (ref 0.0–0.3)
Total Bilirubin: 0.8 mg/dL (ref 0.2–1.2)
Total Protein: 7.5 g/dL (ref 6.0–8.3)

## 2022-10-04 LAB — HEMOGLOBIN A1C: Hgb A1c MFr Bld: 5.1 % (ref 4.6–6.5)

## 2022-11-07 ENCOUNTER — Other Ambulatory Visit: Payer: Self-pay | Admitting: Family Medicine

## 2022-11-08 NOTE — Addendum Note (Signed)
Addended by: Christy Sartorius on: 11/08/2022 03:08 PM   Modules accepted: Orders

## 2022-11-12 ENCOUNTER — Other Ambulatory Visit (HOSPITAL_COMMUNITY): Payer: Self-pay

## 2022-11-13 ENCOUNTER — Other Ambulatory Visit: Payer: Self-pay | Admitting: Family Medicine

## 2022-11-13 DIAGNOSIS — E669 Obesity, unspecified: Secondary | ICD-10-CM

## 2022-11-19 NOTE — Telephone Encounter (Signed)
Patient is currently taking Zepbound 7.5

## 2022-12-03 ENCOUNTER — Encounter: Payer: Self-pay | Admitting: Family Medicine

## 2022-12-06 NOTE — Telephone Encounter (Signed)
Noted  

## 2022-12-24 ENCOUNTER — Other Ambulatory Visit: Payer: Self-pay | Admitting: Family Medicine

## 2022-12-28 NOTE — Telephone Encounter (Signed)
PA was approved and pharmacy is aware.

## 2022-12-28 NOTE — Telephone Encounter (Signed)
PA initiated and sent to plan Key: [id number]

## 2022-12-28 NOTE — Telephone Encounter (Signed)
PA initiated and sent to plan Key: [id number]

## 2023-01-01 ENCOUNTER — Other Ambulatory Visit: Payer: Self-pay | Admitting: Family Medicine

## 2023-01-02 ENCOUNTER — Other Ambulatory Visit: Payer: Self-pay | Admitting: Family Medicine

## 2023-01-04 NOTE — Telephone Encounter (Signed)
Prior Authorization for Zepbound 7.5 approved and patient is aware.

## 2023-01-15 ENCOUNTER — Other Ambulatory Visit (HOSPITAL_COMMUNITY): Payer: Self-pay

## 2023-02-13 ENCOUNTER — Other Ambulatory Visit: Payer: Self-pay | Admitting: Family Medicine

## 2023-02-15 MED ORDER — ZEPBOUND 5 MG/0.5ML ~~LOC~~ SOAJ: 5.0000 mg | SUBCUTANEOUS | 1 refills | Status: DC

## 2023-02-20 ENCOUNTER — Other Ambulatory Visit: Payer: Self-pay | Admitting: Family Medicine

## 2023-05-07 ENCOUNTER — Other Ambulatory Visit: Payer: Self-pay | Admitting: Family Medicine

## 2023-08-02 ENCOUNTER — Telehealth: Payer: Self-pay

## 2023-08-02 ENCOUNTER — Other Ambulatory Visit (HOSPITAL_COMMUNITY): Payer: Self-pay

## 2023-08-02 NOTE — Telephone Encounter (Signed)
 Pharmacy Patient Advocate Encounter   Received notification from CoverMyMeds that prior authorization for Zepbound  7.5MG /0.5ML pen-injectors  is due for renewal.   Insurance verification completed.   The patient is insured through [employer]. KEY [id number] SENT TO PLAN  Action: Patient LAST VISIT WAS 08/15/2022 TO your office Plan requires updated chart notes for PA renewal.  PLEASE BE ADVISED  Ran test claim and PA IS STILL ACTIVE UNTIL 08/25/2023

## 2023-08-05 NOTE — Telephone Encounter (Signed)
 Noted.

## 2023-08-08 ENCOUNTER — Other Ambulatory Visit (HOSPITAL_COMMUNITY): Payer: Self-pay

## 2023-08-10 ENCOUNTER — Other Ambulatory Visit: Payer: Self-pay | Admitting: Family Medicine

## 2023-08-20 ENCOUNTER — Other Ambulatory Visit: Payer: Self-pay | Admitting: Family Medicine

## 2023-08-20 ENCOUNTER — Other Ambulatory Visit (HOSPITAL_COMMUNITY): Payer: Self-pay

## 2023-10-06 ENCOUNTER — Other Ambulatory Visit: Payer: Self-pay | Admitting: Family Medicine

## 2023-10-08 ENCOUNTER — Encounter: Payer: Self-pay | Admitting: Family Medicine

## 2023-10-08 ENCOUNTER — Ambulatory Visit (INDEPENDENT_AMBULATORY_CARE_PROVIDER_SITE_OTHER): Admitting: Family Medicine

## 2023-10-08 VITALS — BP 142/80 | HR 77 | Temp 98.3°F | Wt 169.4 lb

## 2023-10-08 DIAGNOSIS — E039 Hypothyroidism, unspecified: Secondary | ICD-10-CM

## 2023-10-08 DIAGNOSIS — Z0000 Encounter for general adult medical examination without abnormal findings: Secondary | ICD-10-CM

## 2023-10-08 MED ORDER — TIRZEPATIDE-WEIGHT MANAGEMENT 7.5 MG/0.5ML ~~LOC~~ SOLN
7.5000 mg | SUBCUTANEOUS | 0 refills | Status: DC
Start: 2023-10-08 — End: 2023-10-11

## 2023-10-08 NOTE — Progress Notes (Signed)
 Established Patient Office Visit  Subjective   Patient ID: Savannah Summers, female    DOB: Nov 10, 1965  Age: 58 y.o. MRN: [id number]  Chief Complaint  Patient presents with   Annual Exam    HPI   Savannah Summers is seen today for physical exam.  [place] but she still working here.  They hope to eventually retire there.  She is currently taking Zepbound  and thus far has lost about 70 pounds.  Currently on 5 mg.  Her weight has plateaued somewhat and she would like to consider further titration of 7.5 mg.  She walks regularly specially [place].  She has hypothyroidism and is on levothyroxine  112 mcg daily.  Does need follow-up labs.  History of uterine rupture and has had prior hysterectomy.  No indication for further Paps.  She did Cologuard 3/24.  She plans to set up repeat mammograms this summer.  No history of shingles vaccine and she declines.  She has had prior Pneumovax.  Prior hepatitis C screen negative.  Tetanus due 2034.  Social history-married.  Works at [employer].  2 daughters.  Never smoked.  No regular alcohol.  [place]  Family history-significant for sister and possibly mother with rheumatoid arthritis.  Her father died of complications of lung cancer and also had hypertension.  She has another sister without any active medical problems.  Her mother also had history of hypothyroidism and pulmonary fibrosis which may have been related to the RA.   Past Medical History:  Diagnosis Date   Allergy    Arthritis    Chicken pox    Hypothyroidism    Past Surgical History:  Procedure Laterality Date   ABDOMINAL HYSTERECTOMY     BREAST SURGERY     breast reduction   REDUCTION MAMMAPLASTY     TOTAL KNEE ARTHROPLASTY Left 04/21/2018   Procedure: TOTAL KNEE ARTHROPLASTY;  Surgeon: Melodi Lerner, MD;  Location: [facility];  Service: Orthopedics;  Laterality:  Left;     reports that she has never smoked. She has never used smokeless tobacco. She reports current alcohol use. She reports that she does not use drugs. family history includes Arthritis in her mother and sister; Breast cancer in her maternal grandmother; Cancer in an other family member; Cancer (age of onset: 36) in her father; Hypertension in her father; Hypothyroidism in her mother; Pulmonary fibrosis in her mother; Rheum arthritis in her mother and sister. No Known Allergies   Review of Systems  Constitutional:  Negative for chills, fever, malaise/fatigue and weight loss.  HENT:  Negative for hearing loss.   Eyes:  Negative for blurred vision and double vision.  Respiratory:  Negative for cough and shortness of breath.   Cardiovascular:  Negative for chest pain, palpitations and leg swelling.  Gastrointestinal:  Negative for abdominal pain, blood in stool, constipation and diarrhea.  Genitourinary:  Negative for dysuria.  Skin:  Negative for rash.  Neurological:  Negative for dizziness, speech change, seizures, loss of consciousness and headaches.  Psychiatric/Behavioral:  Negative for depression.       Objective:     BP (!) 142/80 (BP Location: Left Arm, Cuff Size: Normal)   Pulse 77   Temp 98.3 F (36.8 C) (Oral)   Wt 169 lb 6.4 oz (76.8 kg)   SpO2 97%   BMI 30.02 kg/m  BP Readings from  Last 3 Encounters:  10/08/23 (!) 142/80  08/15/22 120/80  06/25/22 118/70   Wt Readings from Last 3 Encounters:  10/08/23 169 lb 6.4 oz (76.8 kg)  08/15/22 211 lb (95.7 kg)  06/25/22 227 lb 9.6 oz (103.2 kg)      Physical Exam Vitals reviewed.  Constitutional:      General: She is not in acute distress.    Appearance: She is well-developed. She is not ill-appearing.  HENT:     Head: Normocephalic and atraumatic.   Eyes:     Pupils: Pupils are equal, round, and reactive to light.   Neck:     Thyroid : No thyromegaly.   Cardiovascular:     Rate and Rhythm: Normal  rate and regular rhythm.     Heart sounds: Normal heart sounds. No murmur heard. Pulmonary:     Effort: No respiratory distress.     Breath sounds: Normal breath sounds. No wheezing or rales.  Abdominal:     General: Bowel sounds are normal. There is no distension.     Palpations: Abdomen is soft. There is no mass.     Tenderness: There is no abdominal tenderness. There is no guarding or rebound.   Musculoskeletal:        General: Normal range of motion.     Cervical back: Normal range of motion and neck supple.  Lymphadenopathy:     Cervical: No cervical adenopathy.   Skin:    Findings: No rash.   Neurological:     Mental Status: She is alert and oriented to person, place, and time.     Cranial Nerves: No cranial nerve deficit.   Psychiatric:        Behavior: Behavior normal.        Thought Content: Thought content normal.        Judgment: Judgment normal.      No results found for any visits on 10/08/23.  Last CBC Lab Results  Component Value Date   WBC 4.3 06/25/2022   HGB 15.2 (H) 06/25/2022   HCT 44.4 06/25/2022   MCV 89.5 06/25/2022   MCH 28.8 04/22/2018   RDW 13.0 06/25/2022   PLT 227.0 06/25/2022   Last metabolic panel Lab Results  Component Value Date   GLUCOSE 151 (H) 06/25/2022   NA 139 06/25/2022   K 4.3 06/25/2022   CL 104 06/25/2022   CO2 26 06/25/2022   BUN 12 06/25/2022   CREATININE 0.95 06/25/2022   GFR 66.11 06/25/2022   CALCIUM 9.9 06/25/2022   PROT 7.5 10/03/2022   ALBUMIN 4.2 10/03/2022   BILITOT 0.8 10/03/2022   ALKPHOS 74 10/03/2022   AST 25 10/03/2022   ALT 33 10/03/2022   ANIONGAP 9 04/22/2018   Last lipids Lab Results  Component Value Date   CHOL 213 (H) 06/25/2022   HDL 48.50 06/25/2022   LDLCALC 144 (H) 06/25/2022   TRIG 106.0 06/25/2022   CHOLHDL 4 06/25/2022   Last hemoglobin A1c Lab Results  Component Value Date   HGBA1C 5.1 10/03/2022   Last thyroid  functions Lab Results  Component Value Date   TSH 0.09  (L) 09/03/2022      The 10-year ASCVD risk score (Arnett DK, et al., 2019) is: 4.2%    Assessment & Plan:   Problem List Items Addressed This Visit       Unprioritized   Hypothyroid - Primary   Relevant Orders   TSH   T3, Free   T4, Free   Other Visit  Diagnoses       Physical exam       Relevant Orders   Lipid panel   CBC with Differential/Platelet   Hepatic function panel   Basic metabolic panel with GFR   Hemoglobin A1c     Savannah Summers has done excellent job this past year with weight loss on Zepbound  and her lifestyle management with regular walking.  We discussed several health maintenance items as follows  -Discussed shingles vaccine and she declines - Tetanus up-to-date - She will schedule repeat mammogram which she plans to do around her [age] - We discussed increasing Zepbound  to 7.5 mg as a transition dose and if tolerating well in 1 month she will let us  know we will increase this further to 10 mg - Continue regular exercise habits - She will return this Thursday for fasting labs - Blood pressure was up slightly today.  She will monitor at home over the next few weeks and be in touch as consistently greater than 140/90   No follow-ups on file.    Wolm Scarlet, MD

## 2023-10-08 NOTE — Patient Instructions (Signed)
 Monitor blood pressure and be in touch if consistently > 140/90.

## 2023-10-10 ENCOUNTER — Other Ambulatory Visit

## 2023-10-10 DIAGNOSIS — E039 Hypothyroidism, unspecified: Secondary | ICD-10-CM | POA: Diagnosis not present

## 2023-10-10 DIAGNOSIS — Z0000 Encounter for general adult medical examination without abnormal findings: Secondary | ICD-10-CM

## 2023-10-10 LAB — CBC WITH DIFFERENTIAL/PLATELET
Basophils Absolute: 0 10*3/uL (ref 0.0–0.1)
Basophils Relative: 1.1 % (ref 0.0–3.0)
Eosinophils Absolute: 0.2 10*3/uL (ref 0.0–0.7)
Eosinophils Relative: 7 % — ABNORMAL HIGH (ref 0.0–5.0)
HCT: 41 % (ref 36.0–46.0)
Hemoglobin: 14 g/dL (ref 12.0–15.0)
Lymphocytes Relative: 32.4 % (ref 12.0–46.0)
Lymphs Abs: 1.1 10*3/uL (ref 0.7–4.0)
MCHC: 34.2 g/dL (ref 30.0–36.0)
MCV: 87 fl (ref 78.0–100.0)
Monocytes Absolute: 0.4 10*3/uL (ref 0.1–1.0)
Monocytes Relative: 11.1 % (ref 3.0–12.0)
Neutro Abs: 1.7 10*3/uL (ref 1.4–7.7)
Neutrophils Relative %: 48.4 % (ref 43.0–77.0)
Platelets: 215 10*3/uL (ref 150.0–400.0)
RBC: 4.72 Mil/uL (ref 3.87–5.11)
RDW: 12.9 % (ref 11.5–15.5)
WBC: 3.5 10*3/uL — ABNORMAL LOW (ref 4.0–10.5)

## 2023-10-10 LAB — BASIC METABOLIC PANEL WITH GFR
BUN: 16 mg/dL (ref 6–23)
CO2: 26 meq/L (ref 19–32)
Calcium: 9.6 mg/dL (ref 8.4–10.5)
Chloride: 104 meq/L (ref 96–112)
Creatinine, Ser: 0.78 mg/dL (ref 0.40–1.20)
GFR: 83 mL/min (ref >60.00)
Glucose, Bld: 82 mg/dL (ref 70–99)
Potassium: 4.1 meq/L (ref 3.5–5.1)
Sodium: 138 meq/L (ref 135–145)

## 2023-10-10 LAB — LIPID PANEL
Cholesterol: 171 mg/dL (ref 0–200)
HDL: 51.6 mg/dL (ref >39.00)
LDL Cholesterol: 104 mg/dL — ABNORMAL HIGH (ref 0–99)
NonHDL: 119.09
Total CHOL/HDL Ratio: 3
Triglycerides: 77 mg/dL (ref 0.0–149.0)
VLDL: 15.4 mg/dL (ref 0.0–40.0)

## 2023-10-10 LAB — HEPATIC FUNCTION PANEL
ALT: 15 U/L (ref 0–35)
AST: 19 U/L (ref 0–37)
Albumin: 4.1 g/dL (ref 3.5–5.2)
Alkaline Phosphatase: 89 U/L (ref 39–117)
Bilirubin, Direct: 0.2 mg/dL (ref 0.0–0.3)
Total Bilirubin: 0.9 mg/dL (ref 0.2–1.2)
Total Protein: 6.8 g/dL (ref 6.0–8.3)

## 2023-10-10 LAB — T4, FREE: Free T4: 1.58 ng/dL (ref 0.60–1.60)

## 2023-10-10 LAB — HEMOGLOBIN A1C: Hgb A1c MFr Bld: 5.1 % (ref 4.6–6.5)

## 2023-10-10 LAB — T3, FREE: T3, Free: 3.5 pg/mL (ref 2.3–4.2)

## 2023-10-10 LAB — TSH: TSH: 0.02 u[IU]/mL — ABNORMAL LOW (ref 0.35–5.50)

## 2023-10-11 ENCOUNTER — Other Ambulatory Visit (HOSPITAL_COMMUNITY): Payer: Self-pay

## 2023-10-11 ENCOUNTER — Telehealth: Payer: Self-pay | Admitting: Family Medicine

## 2023-10-11 ENCOUNTER — Telehealth: Payer: Self-pay

## 2023-10-11 MED ORDER — TIRZEPATIDE-WEIGHT MANAGEMENT 7.5 MG/0.5ML ~~LOC~~ SOLN: 7.5000 mg | SUBCUTANEOUS | 0 refills | Status: DC

## 2023-10-11 NOTE — Telephone Encounter (Signed)
 Rx done.

## 2023-10-11 NOTE — Telephone Encounter (Signed)
 Pharmacy Patient Advocate Encounter  Received notification from [facility] that Prior Authorization for Zepbound  7.5 has been APPROVED from 10/11/23 to 10/10/24. Ran test claim, Copay is $161.81. This test claim was processed through [facility]- copay amounts may vary at other pharmacies due to pharmacy/plan contracts, or as the patient moves through the different stages of their insurance plan.   PA #/Case ID/Reference #: [id number]

## 2023-10-11 NOTE — Telephone Encounter (Signed)
 Pharmacy Patient Advocate Encounter   Received notification from CoverMyMeds that prior authorization for Zepbound  7.5 is required/requested.   Insurance verification completed.   The patient is insured through [employer] .   Per test claim: PA required; PA submitted to above mentioned insurance via CoverMyMeds Key/confirmation #/EOC [id number] Status is pending

## 2023-10-11 NOTE — Telephone Encounter (Signed)
 Per patient's message on MyChart--  My new Zepbound  7.5 RX was sent to the wrong pharmacy.  Please send to [facility] at [address] [place].  Thanks

## 2023-10-13 ENCOUNTER — Ambulatory Visit: Payer: Self-pay | Admitting: Family Medicine

## 2023-10-13 DIAGNOSIS — E039 Hypothyroidism, unspecified: Secondary | ICD-10-CM

## 2023-10-14 MED ORDER — LEVOTHYROXINE SODIUM 100 MCG PO TABS: 100.0000 ug | ORAL_TABLET | Freq: Every day | ORAL | 0 refills | Status: DC

## 2023-10-14 NOTE — Addendum Note (Signed)
 Addended by: METTA KRISTEN CROME on: 10/14/2023 02:34 PM   Modules accepted: Orders

## 2023-10-23 ENCOUNTER — Encounter: Payer: Self-pay | Admitting: Family Medicine

## 2023-10-28 NOTE — Telephone Encounter (Signed)
 Letter done.   Sent copy to her through MyChart.  We can print one out if she needs it.  Wolm LELON Scarlet MD [facility]

## 2023-11-07 MED ORDER — ZEPBOUND 10 MG/0.5ML ~~LOC~~ SOAJ: 10.0000 mg | SUBCUTANEOUS | 0 refills | Status: DC

## 2023-11-07 NOTE — Addendum Note (Signed)
 Addended by: METTA KRISTEN CROME on: 11/07/2023 12:05 PM   Modules accepted: Orders

## 2023-12-26 ENCOUNTER — Other Ambulatory Visit: Payer: Self-pay | Admitting: Family Medicine

## 2023-12-26 DIAGNOSIS — Z1231 Encounter for screening mammogram for malignant neoplasm of breast: Secondary | ICD-10-CM

## 2024-01-02 ENCOUNTER — Ambulatory Visit
Admission: RE | Admit: 2024-01-02 | Discharge: 2024-01-02 | Disposition: A | Discharge status: Home / Self Care | Source: Ambulatory Visit

## 2024-01-02 DIAGNOSIS — Z1231 Encounter for screening mammogram for malignant neoplasm of breast: Secondary | ICD-10-CM | POA: Diagnosis not present

## 2024-01-05 ENCOUNTER — Other Ambulatory Visit: Payer: Self-pay | Admitting: Family Medicine

## 2024-02-11 ENCOUNTER — Other Ambulatory Visit: Payer: Self-pay | Admitting: Family Medicine

## 2024-05-05 ENCOUNTER — Encounter: Payer: Self-pay | Admitting: Family Medicine

## 2024-05-18 ENCOUNTER — Other Ambulatory Visit: Payer: Self-pay | Admitting: Family Medicine
